# Patient Record
Sex: Female | Born: 1975 | Race: White | Hispanic: No | Marital: Married | Smoking: Never smoker
Health system: Southern US, Community
[De-identification: ages and names within clinical notes are randomized; demographics above are authoritative.]

## PROBLEM LIST (undated history)

## (undated) DIAGNOSIS — I472 Ventricular tachycardia, unspecified: Secondary | ICD-10-CM

---

## 2000-02-03 ENCOUNTER — Other Ambulatory Visit: Admission: RE | Admit: 2000-02-03 | Discharge: 2000-02-03 | Payer: Self-pay | Admitting: Gynecology

## 2001-04-12 ENCOUNTER — Other Ambulatory Visit: Admission: RE | Admit: 2001-04-12 | Discharge: 2001-04-12 | Payer: Self-pay | Admitting: Gynecology

## 2001-04-15 ENCOUNTER — Encounter: Payer: Self-pay | Admitting: Gynecology

## 2001-04-15 ENCOUNTER — Encounter: Admission: RE | Admit: 2001-04-15 | Discharge: 2001-04-15 | Payer: Self-pay | Admitting: Gynecology

## 2002-05-30 ENCOUNTER — Other Ambulatory Visit: Admission: RE | Admit: 2002-05-30 | Discharge: 2002-05-30 | Payer: Self-pay | Admitting: Gynecology

## 2003-06-29 ENCOUNTER — Other Ambulatory Visit: Admission: RE | Admit: 2003-06-29 | Discharge: 2003-06-29 | Payer: Self-pay | Admitting: Gynecology

## 2004-06-10 ENCOUNTER — Other Ambulatory Visit: Admission: RE | Admit: 2004-06-10 | Discharge: 2004-06-10 | Payer: Self-pay | Admitting: Gynecology

## 2004-06-12 ENCOUNTER — Encounter (INDEPENDENT_AMBULATORY_CARE_PROVIDER_SITE_OTHER): Payer: Self-pay | Admitting: Specialist

## 2004-06-12 ENCOUNTER — Ambulatory Visit (HOSPITAL_COMMUNITY): Admission: RE | Admit: 2004-06-12 | Discharge: 2004-06-12 | Payer: Self-pay | Admitting: Gynecology

## 2004-06-12 ENCOUNTER — Ambulatory Visit (HOSPITAL_BASED_OUTPATIENT_CLINIC_OR_DEPARTMENT_OTHER): Admission: RE | Admit: 2004-06-12 | Discharge: 2004-06-12 | Payer: Self-pay | Admitting: Gynecology

## 2004-12-04 ENCOUNTER — Other Ambulatory Visit: Admission: RE | Admit: 2004-12-04 | Discharge: 2004-12-04 | Payer: Self-pay | Admitting: Gynecology

## 2005-06-09 ENCOUNTER — Inpatient Hospital Stay (HOSPITAL_COMMUNITY): Admission: AD | Admit: 2005-06-09 | Discharge: 2005-06-11 | Payer: Self-pay | Admitting: Gynecology

## 2005-07-23 ENCOUNTER — Other Ambulatory Visit: Admission: RE | Admit: 2005-07-23 | Discharge: 2005-07-23 | Payer: Self-pay | Admitting: Gynecology

## 2006-09-14 ENCOUNTER — Other Ambulatory Visit: Admission: RE | Admit: 2006-09-14 | Discharge: 2006-09-14 | Payer: Self-pay | Admitting: Gynecology

## 2008-02-14 ENCOUNTER — Inpatient Hospital Stay (HOSPITAL_COMMUNITY): Admission: RE | Admit: 2008-02-14 | Discharge: 2008-02-15 | Payer: Self-pay | Admitting: Certified Nurse Midwife

## 2011-01-10 NOTE — H&P (Signed)
Rhonda Duncan, Rhonda Duncan          ACCOUNT NO.:  [id number]   MEDICAL RECORD NO.:  [id number]          PATIENT TYPE:  INP   LOCATION:  9166                          FACILITY:  [facility]   PHYSICIAN:  Timothy P. Fontaine, M.D.DATE OF BIRTH:  [birth year]   DATE OF ADMISSION:  06/09/2005  DATE OF DISCHARGE:                                HISTORY & PHYSICAL   CHIEF COMPLAINT:  Spontaneous rupture of membranes, labor.   HISTORY OF PRESENT ILLNESS:  A 35 year old G3, P0, AB2 female [redacted] weeks  gestation who enters with a history of spontaneous rupture of membranes and  subsequent contractions.  Triage evaluation revealed patient contracting on  a regular basis with a cervical examination of 3 cm, positive rupture of  membranes and she was admitted for labor and delivery.  Patient's beta Strep  was negative and her pregnancy is uncomplicated to date.  For remainder of  her history see her [facility].   PHYSICAL EXAMINATION:  HEENT:  Normal.  LUNGS:  Clear.  CARDIAC:  Regular rate without murmurs, rubs, or gallops.  ABDOMEN:  Gravid, vertex fetus consistent with term.  External monitors with  regular contractions q.3-4 minutes with a reactive fetal tracing.  No  decelerations.  PELVIC:  Patient is complete, complete +2 station, and pushing.   ASSESSMENT:  A 35 year old G3, P0 [redacted] weeks gestation.  Spontaneous rupture  of membranes.  Spontaneous labor at complete, complete, pushing, and  anticipation for spontaneous vaginal delivery.  Beta Strep is negative.      Timothy P. Fontaine, M.D.  Electronically Signed     TPF/MEDQ  D:  06/09/2005  T:  06/09/2005  Job:  [id number]

## 2011-01-10 NOTE — H&P (Signed)
Rhonda Duncan, HARTL          ACCOUNT NO.:  [id number]   MEDICAL RECORD NO.:  [id number]          PATIENT TYPE:  AMB   LOCATION:  [facility]                         FACILITY:  [facility]   PHYSICIAN:  Ivor Costa. Farrel Gobble, M.D. DATE OF BIRTH:  [birth year]   DATE OF ADMISSION:  06/12/2004  DATE OF DISCHARGE:                                HISTORY & PHYSICAL   CHIEF COMPLAINT:  Missed AB.   HISTORY OF PRESENT ILLNESS:  The patient is a 35 year old G2 P0 with an LMP  of March 29, 2004, estimated gestational age of 10+ weeks, who presented  yesterday for a new OB appointment.  The patient denied any bleeding or  cramping but was not found to have fetal heart tones at the time of our  appointment, an ultrasound performed showed a missed AB with a gestational  sac that measured 7-1/2 weeks, a crown-rump length that measured less than 6  weeks and no fetal heart motion was seen, there was a small subchorionic  hematoma that measured 14 x 12 x 8 mm.  She elects to have definitive  surgery in the form of a D&C.  She is O positive, antibody negative, her  hemoglobin was 13.7 with a hematocrit of 41.4.   PAST OBSTETRIC AND GYNECOLOGIC HISTORY:  Her past OB-GYN history is  unremarkable.  She had been contracepting with birth control pills prior to  planning this pregnancy.  Her periods are monthly.  Her Pap smears have all  been normal.  She has no history of fibroids or ovarian cysts.   MEDICAL HISTORY:  Negative.   PAST SURGICAL HISTORY:  Negative.   FAMILY HISTORY:  Negative for recurrent miscarriage or aneuploidy.   MEDICATIONS:  Prenatal vitamins.   ALLERGIES:  She has no known drug allergies.   PHYSICAL EXAMINATION:  GENERAL:  She is in no acute distress.  HEART:  Her heart is regular rate.  LUNGS:  Her lungs are clear to auscultation.  ABDOMEN:  Her abdomen is obese, soft, and nontender.  GU:  She has normal external female genitalia.  The BUS is negative.  The  vagina is pink and moist.   The cervix is without gross lesions.  Bimanual  exam:  The uterus is axial, mobile, nontender, and felt appropriate for  gestational age and as mentioned above no fetal heart tones are auscultated.   ASSESSMENT:  Missed abortion at 10+ weeks.   PLAN:  The patient had been counseled with regard to expectant management  versus D&C.  They called the office back on June 11, 2004 requesting to  have a D&C done and will therefore present now on June 12, 2004 to have  the procedure.  Risks of bleeding, infection, uterine perforation were  discussed and accepted.  She had been given a prescription for Lortab on  June 10, 2004 in case she should start bleeding and cramping that  evening.     Trac   THL/MEDQ  D:  06/11/2004  T:  06/11/2004  Job:  [id number]

## 2011-01-10 NOTE — Op Note (Signed)
NAMEARITZA, BRUNET          ACCOUNT NO.:  [id number]   MEDICAL RECORD NO.:  [id number]          PATIENT TYPE:  AMB   LOCATION:  [facility]                         FACILITY:  [facility]   PHYSICIAN:  Ivor Costa. Farrel Gobble, M.D. DATE OF BIRTH:  1976/06/24   DATE OF PROCEDURE:  06/12/2004  DATE OF DISCHARGE:                                 OPERATIVE REPORT   PREOPERATIVE DIAGNOSIS:  Missed abortion at 10+ weeks.   POSTOPERATIVE DIAGNOSIS:  Missed abortion at 10+ weeks.   PROCEDURE:  First trimester D&E.   SURGEON:  Ivor Costa. Farrel Gobble, M.D.   ANESTHESIA:  General.   ESTIMATED BLOOD LOSS:  Less than 100.   FINDINGS:  Uterus was axial sounding to 10 cm with contours.  Pathology was  uterine contents.   COMPLICATIONS:  None.   DESCRIPTION OF PROCEDURE:  The patient was taken to the operating room.  General anesthesia was induced and placed in the dorsal lithotomy position,  prepped and draped in the usual sterile fashion.  A bimanual exam was  performed. Orientation of the uterus was confirmed.  A sterile weighted  speculum was placed in the vagina.  The cervix was visualized and stabilized  with a single-tooth tenaculum.  The cervix was then gently dilated up to #25  [place], during which time a sound was placed for orientation inside of the  canal.  A #8 suction curette was then advanced to the cervix towards the  fundus, and the cavity was cleared of all debris.  A gentle curettage  afterwards confirmed smooth walls and cry on walls plus fundus.  The patient  received a shot of Methergine intramuscularly.  Replacement of the suction  failed to produce any further blood.  The patient tolerated the procedure  well.  Sponge, lap, and needle counts were correct x 2 and transferred to  the PACU in stable condition.     Trac   THL/MEDQ  D:  06/12/2004  T:  06/12/2004  Job:  [id number]

## 2011-05-22 LAB — CBC
HCT: 33.4 — ABNORMAL LOW
HCT: 36.8
Hemoglobin: 11.6 — ABNORMAL LOW
Hemoglobin: 12.9
MCHC: 34.7
MCHC: 35.1
MCV: 90.3
MCV: 91.9
Platelets: 190
Platelets: 224
RBC: 3.63 — ABNORMAL LOW
RBC: 4.08
RDW: 13.5
RDW: 13.9
WBC: 10.5
WBC: 11.8 — ABNORMAL HIGH

## 2011-05-22 LAB — RPR: RPR Ser Ql: NONREACTIVE

## 2014-01-09 ENCOUNTER — Other Ambulatory Visit: Payer: Self-pay

## 2014-01-09 DIAGNOSIS — Z1231 Encounter for screening mammogram for malignant neoplasm of breast: Secondary | ICD-10-CM

## 2014-02-06 ENCOUNTER — Ambulatory Visit: Payer: Self-pay

## 2014-02-06 ENCOUNTER — Ambulatory Visit
Admission: RE | Admit: 2014-02-06 | Discharge: 2014-02-06 | Disposition: A | Discharge status: Home / Self Care | Payer: BC Managed Care – PPO | Source: Ambulatory Visit

## 2014-02-06 DIAGNOSIS — Z1231 Encounter for screening mammogram for malignant neoplasm of breast: Secondary | ICD-10-CM

## 2015-02-08 ENCOUNTER — Other Ambulatory Visit: Payer: Self-pay

## 2015-02-08 DIAGNOSIS — Z1231 Encounter for screening mammogram for malignant neoplasm of breast: Secondary | ICD-10-CM

## 2015-03-07 ENCOUNTER — Ambulatory Visit
Admission: RE | Admit: 2015-03-07 | Discharge: 2015-03-07 | Disposition: A | Discharge status: Home / Self Care | Payer: BC Managed Care – PPO | Source: Ambulatory Visit

## 2015-03-07 DIAGNOSIS — Z1231 Encounter for screening mammogram for malignant neoplasm of breast: Secondary | ICD-10-CM

## 2015-03-09 ENCOUNTER — Other Ambulatory Visit: Payer: Self-pay | Admitting: Certified Nurse Midwife

## 2015-03-09 DIAGNOSIS — R928 Other abnormal and inconclusive findings on diagnostic imaging of breast: Secondary | ICD-10-CM

## 2015-03-15 ENCOUNTER — Ambulatory Visit
Admission: RE | Admit: 2015-03-15 | Discharge: 2015-03-15 | Disposition: A | Discharge status: Home / Self Care | Payer: BC Managed Care – PPO | Source: Ambulatory Visit | Attending: Certified Nurse Midwife | Admitting: Certified Nurse Midwife

## 2015-03-15 DIAGNOSIS — R928 Other abnormal and inconclusive findings on diagnostic imaging of breast: Secondary | ICD-10-CM

## 2016-03-05 ENCOUNTER — Other Ambulatory Visit: Payer: Self-pay | Admitting: Certified Nurse Midwife

## 2016-03-05 DIAGNOSIS — Z1231 Encounter for screening mammogram for malignant neoplasm of breast: Secondary | ICD-10-CM

## 2016-03-24 ENCOUNTER — Ambulatory Visit
Admission: RE | Admit: 2016-03-24 | Discharge: 2016-03-24 | Disposition: A | Discharge status: Home / Self Care | Payer: BC Managed Care – PPO | Source: Ambulatory Visit | Attending: Certified Nurse Midwife | Admitting: Certified Nurse Midwife

## 2016-03-24 DIAGNOSIS — Z1231 Encounter for screening mammogram for malignant neoplasm of breast: Secondary | ICD-10-CM

## 2017-02-17 ENCOUNTER — Other Ambulatory Visit: Payer: Self-pay | Admitting: Certified Nurse Midwife

## 2017-02-17 DIAGNOSIS — Z1231 Encounter for screening mammogram for malignant neoplasm of breast: Secondary | ICD-10-CM

## 2017-04-02 ENCOUNTER — Ambulatory Visit
Admission: RE | Admit: 2017-04-02 | Discharge: 2017-04-02 | Disposition: A | Discharge status: Home / Self Care | Payer: BC Managed Care – PPO | Source: Ambulatory Visit | Attending: Certified Nurse Midwife | Admitting: Certified Nurse Midwife

## 2017-04-02 DIAGNOSIS — Z1231 Encounter for screening mammogram for malignant neoplasm of breast: Secondary | ICD-10-CM

## 2017-04-06 ENCOUNTER — Other Ambulatory Visit: Payer: Self-pay | Admitting: Certified Nurse Midwife

## 2017-04-06 DIAGNOSIS — R928 Other abnormal and inconclusive findings on diagnostic imaging of breast: Secondary | ICD-10-CM

## 2017-04-10 ENCOUNTER — Other Ambulatory Visit: Payer: Self-pay | Admitting: Certified Nurse Midwife

## 2017-04-10 ENCOUNTER — Ambulatory Visit
Admission: RE | Admit: 2017-04-10 | Discharge: 2017-04-10 | Disposition: A | Discharge status: Home / Self Care | Payer: BC Managed Care – PPO | Source: Ambulatory Visit | Attending: Certified Nurse Midwife | Admitting: Certified Nurse Midwife

## 2017-04-10 ENCOUNTER — Other Ambulatory Visit: Payer: BC Managed Care – PPO

## 2017-04-10 DIAGNOSIS — R928 Other abnormal and inconclusive findings on diagnostic imaging of breast: Secondary | ICD-10-CM

## 2017-08-25 HISTORY — PX: BREAST BIOPSY: SHX20

## 2017-10-12 ENCOUNTER — Other Ambulatory Visit: Payer: Self-pay | Admitting: Certified Nurse Midwife

## 2017-10-12 ENCOUNTER — Ambulatory Visit
Admission: RE | Admit: 2017-10-12 | Discharge: 2017-10-12 | Disposition: A | Discharge status: Home / Self Care | Payer: BC Managed Care – PPO | Source: Ambulatory Visit | Attending: Certified Nurse Midwife | Admitting: Certified Nurse Midwife

## 2017-10-12 DIAGNOSIS — N6489 Other specified disorders of breast: Secondary | ICD-10-CM

## 2017-10-12 DIAGNOSIS — R928 Other abnormal and inconclusive findings on diagnostic imaging of breast: Secondary | ICD-10-CM

## 2018-02-22 ENCOUNTER — Other Ambulatory Visit: Payer: Self-pay | Admitting: Certified Nurse Midwife

## 2018-02-22 DIAGNOSIS — Z1231 Encounter for screening mammogram for malignant neoplasm of breast: Secondary | ICD-10-CM

## 2018-04-06 ENCOUNTER — Ambulatory Visit
Admission: RE | Admit: 2018-04-06 | Discharge: 2018-04-06 | Disposition: A | Discharge status: Home / Self Care | Payer: BC Managed Care – PPO | Source: Ambulatory Visit | Attending: Certified Nurse Midwife | Admitting: Certified Nurse Midwife

## 2018-04-06 DIAGNOSIS — Z1231 Encounter for screening mammogram for malignant neoplasm of breast: Secondary | ICD-10-CM

## 2018-07-02 ENCOUNTER — Inpatient Hospital Stay (HOSPITAL_COMMUNITY)
Admission: EM | Admit: 2018-07-02 | Discharge: 2018-07-04 | DRG: 310 | Disposition: A | Discharge status: Home / Self Care | Payer: BC Managed Care – PPO | Attending: Cardiology | Admitting: Cardiology

## 2018-07-02 ENCOUNTER — Other Ambulatory Visit: Payer: Self-pay

## 2018-07-02 ENCOUNTER — Encounter (HOSPITAL_COMMUNITY): Payer: Self-pay | Admitting: *Deleted

## 2018-07-02 ENCOUNTER — Emergency Department (HOSPITAL_COMMUNITY): Payer: BC Managed Care – PPO

## 2018-07-02 DIAGNOSIS — E875 Hyperkalemia: Secondary | ICD-10-CM | POA: Diagnosis not present

## 2018-07-02 DIAGNOSIS — I472 Ventricular tachycardia: Secondary | ICD-10-CM | POA: Diagnosis not present

## 2018-07-02 DIAGNOSIS — I4729 Other ventricular tachycardia: Secondary | ICD-10-CM

## 2018-07-02 DIAGNOSIS — I340 Nonrheumatic mitral (valve) insufficiency: Secondary | ICD-10-CM | POA: Diagnosis not present

## 2018-07-02 HISTORY — DX: Ventricular tachycardia, unspecified: I47.20

## 2018-07-02 HISTORY — DX: Ventricular tachycardia: I47.2

## 2018-07-02 LAB — CBC WITH DIFFERENTIAL/PLATELET
ABS IMMATURE GRANULOCYTES: 0.04 10*3/uL (ref 0.00–0.07)
BASOS PCT: 1 %
Basophils Absolute: 0.1 10*3/uL (ref 0.0–0.1)
EOS PCT: 0 %
Eosinophils Absolute: 0 10*3/uL (ref 0.0–0.5)
HCT: 46 % (ref 36.0–46.0)
HEMOGLOBIN: 14.5 g/dL (ref 12.0–15.0)
Immature Granulocytes: 0 %
LYMPHS PCT: 19 %
Lymphs Abs: 2.6 10*3/uL (ref 0.7–4.0)
MCH: 29.4 pg (ref 26.0–34.0)
MCHC: 31.5 g/dL (ref 30.0–36.0)
MCV: 93.1 fL (ref 80.0–100.0)
MONOS PCT: 6 %
Monocytes Absolute: 0.8 10*3/uL (ref 0.1–1.0)
Neutro Abs: 9.9 10*3/uL — ABNORMAL HIGH (ref 1.7–7.7)
Neutrophils Relative %: 74 %
Platelets: 295 10*3/uL (ref 150–400)
RBC: 4.94 MIL/uL (ref 3.87–5.11)
RDW: 11.9 % (ref 11.5–15.5)
WBC: 13.4 10*3/uL — ABNORMAL HIGH (ref 4.0–10.5)
nRBC: 0 % (ref 0.0–0.2)

## 2018-07-02 LAB — I-STAT TROPONIN, ED: Troponin i, poc: 0 ng/mL (ref 0.00–0.08)

## 2018-07-02 LAB — COMPREHENSIVE METABOLIC PANEL
ALBUMIN: 4.1 g/dL (ref 3.5–5.0)
ALT: 28 U/L (ref 0–44)
AST: 42 U/L — AB (ref 15–41)
Alkaline Phosphatase: 84 U/L (ref 38–126)
Anion gap: 12 (ref 5–15)
BILIRUBIN TOTAL: 1.7 mg/dL — AB (ref 0.3–1.2)
BUN: 13 mg/dL (ref 6–20)
CO2: 21 mmol/L — ABNORMAL LOW (ref 22–32)
CREATININE: 0.86 mg/dL (ref 0.44–1.00)
Calcium: 9.1 mg/dL (ref 8.9–10.3)
Chloride: 104 mmol/L (ref 98–111)
GFR calc Af Amer: >60 mL/min (ref >60)
GFR calc non Af Amer: >60 mL/min (ref >60)
GLUCOSE: 110 mg/dL — AB (ref 70–99)
POTASSIUM: 5.8 mmol/L — AB (ref 3.5–5.1)
Sodium: 137 mmol/L (ref 135–145)
TOTAL PROTEIN: 7.5 g/dL (ref 6.5–8.1)

## 2018-07-02 LAB — I-STAT BETA HCG BLOOD, ED (MC, WL, AP ONLY): I-stat hCG, quantitative: <5 m[IU]/mL (ref <5)

## 2018-07-02 LAB — I-STAT CHEM 8, ED
BUN: 17 mg/dL (ref 6–20)
CHLORIDE: 107 mmol/L (ref 98–111)
Calcium, Ion: 1.03 mmol/L — ABNORMAL LOW (ref 1.15–1.40)
Creatinine, Ser: 0.7 mg/dL (ref 0.44–1.00)
GLUCOSE: 112 mg/dL — AB (ref 70–99)
HCT: 44 % (ref 36.0–46.0)
Hemoglobin: 15 g/dL (ref 12.0–15.0)
Potassium: 5.2 mmol/L — ABNORMAL HIGH (ref 3.5–5.1)
Sodium: 140 mmol/L (ref 135–145)
TCO2: 24 mmol/L (ref 22–32)

## 2018-07-02 LAB — TSH: TSH: 1.637 u[IU]/mL (ref 0.350–4.500)

## 2018-07-02 LAB — MAGNESIUM: MAGNESIUM: 1.9 mg/dL (ref 1.7–2.4)

## 2018-07-02 MED ORDER — ENOXAPARIN SODIUM 40 MG/0.4ML ~~LOC~~ SOLN
40.0000 mg | SUBCUTANEOUS | Status: DC
  Administered 2018-07-03 – 2018-07-04 (×2): 40 mg via SUBCUTANEOUS
  Filled 2018-07-02 (×2): qty 0.4

## 2018-07-02 MED ORDER — AMIODARONE HCL IN DEXTROSE 360-4.14 MG/200ML-% IV SOLN
60.0000 mg/h | Freq: Once | INTRAVENOUS | Status: AC
  Administered 2018-07-02: 60 mg/h via INTRAVENOUS
  Filled 2018-07-02: qty 200

## 2018-07-02 MED ORDER — ACETAMINOPHEN 325 MG PO TABS
650.0000 mg | ORAL_TABLET | Freq: Four times a day (QID) | ORAL | Status: DC | PRN
  Administered 2018-07-03 – 2018-07-04 (×2): 650 mg via ORAL
  Filled 2018-07-02 (×2): qty 2

## 2018-07-02 MED ORDER — ACETAMINOPHEN 650 MG RE SUPP: 650.0000 mg | Freq: Four times a day (QID) | RECTAL | Status: DC | PRN

## 2018-07-02 MED ORDER — SODIUM CHLORIDE 0.9% FLUSH: 3.0000 mL | INTRAVENOUS | Status: DC | PRN

## 2018-07-02 MED ORDER — AMIODARONE HCL IN DEXTROSE 360-4.14 MG/200ML-% IV SOLN: 30.0000 mg/h | INTRAVENOUS | Status: DC

## 2018-07-02 MED ORDER — SODIUM CHLORIDE 0.9 % IV SOLN: 250.0000 mL | INTRAVENOUS | Status: DC | PRN

## 2018-07-02 MED ORDER — AMIODARONE HCL IN DEXTROSE 360-4.14 MG/200ML-% IV SOLN
60.0000 mg/h | INTRAVENOUS | Status: DC
  Administered 2018-07-03: 60 mg/h via INTRAVENOUS
  Filled 2018-07-02: qty 200

## 2018-07-02 MED ORDER — AMIODARONE IV BOLUS ONLY 150 MG/100ML
150.0000 mg | Freq: Once | INTRAVENOUS | Status: AC
  Administered 2018-07-02: 150 mg via INTRAVENOUS
  Filled 2018-07-02: qty 100

## 2018-07-02 MED ORDER — SODIUM CHLORIDE 0.9% FLUSH
3.0000 mL | Freq: Two times a day (BID) | INTRAVENOUS | Status: DC
  Administered 2018-07-04 (×2): 3 mL via INTRAVENOUS

## 2018-07-02 NOTE — ED Triage Notes (Signed)
thwe pt has had diarrhea  For the past 2 or 3 days.  Today she went to her doctors office and was sent here for an irregular heart beat  No chest pain she feels breathless  lmp oct 30th

## 2018-07-02 NOTE — ED Notes (Signed)
Someone from cards has seen the pt  Periods of rapid heart rate

## 2018-07-02 NOTE — ED Notes (Signed)
2 w did not want this cardiac pt  Waiting on another room

## 2018-07-02 NOTE — H&P (Addendum)
Cardiology Admission History and Physical:   Patient ID: Rhonda Duncan MRN: [id number]; DOB: 1975/10/27   Admission date: 07/02/2018  Primary Care Provider: Gweneth Dimitri, MD Primary Cardiologist: New to [facility] Primary Electrophysiologist:  None   Chief Complaint:  Referred from outpatient office for abnormal heart rhythm  Patient Profile:   Rhonda Duncan is a 42 y.o. female with no significant PMH who presents with episodes of SOB and lightheadedness, found to have an arrhythmia at outpatient office.   History of Present Illness:   Ms. Beamon has had intermittent episodes of sudden onset breathlessness/SOB and lightheadedness which last for seconds at a time over the past week. She reported increasing frequency of episodes over the past several days. She denies any chest pain associated with these symptoms. 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 She reported that her husband checked her pulse when she got home and felt like it was irregular. She presented to an urgent care, was told she had an irregular heart beat, and was recommended to present to the ED for further evaluation.   She denies prior cardiac history. She reported having some indigestion type pain in the last 1-2 years for which she underwent an EKG and blood work at that time which was unrevealing for any abnormalities. No ischemic evaluation in the past. She denies history of HTN, DM, HLD, or tobacco use. She reports a family history of CAD: father (alive) with CABG in his 51s and paternal grandfather died from an MI in his 38s-60s.   At the time of this evaluation she is asymptomatic. She reports having watery diarrhea for the past 3 days with 2 episodes per day. Otherwise no recent fever, nausea, vomiting, hematochezia, or melena. She has never experienced exertional chest pain. She reports occasional heartburn. She denies orthopnea,  PND, LE edema, syncope, or palpitations.  ED course: Intermittently tachycardic and hypertensive, otherwise VSS. Labs notable for K 5.2, Cr 0.7, WBC 13.4, Hgb 14.5, PLT 295, Trop negative x1, B hcg negative. EKG with VT. CXR with pulmonary venous hypertension. She was started on amiodarone in the ED. Cardiology asked to admit.  History reviewed. No pertinent past medical history.  History reviewed. No pertinent surgical history.   Medications Prior to Admission: Prior to Admission medications   Not on File     Allergies:   No Known Allergies  Social History:   Social History   Socioeconomic History  . Marital status: Married    Spouse name: Not on file  . Number of children: Not on file  . Years of education: Not on file  . Highest education level: Not on file  Occupational History  . Not on file  Social Needs  . Financial resource strain: Not on file  . Food insecurity:    Worry: Not on file    Inability: Not on file  . Transportation needs:    Medical: Not on file    Non-medical: Not on file  Tobacco Use  . Smoking status: Never Smoker  . Smokeless tobacco: Never Used  Substance and Sexual Activity  . Alcohol use: Yes  . Drug use: Never  . Sexual activity: Not on file  Lifestyle  . Physical activity:    Days per week: Not on file    Minutes per session: Not on file  . Stress: Not on file  Relationships  . Social connections:    Talks  on phone: Not on file    Gets together: Not on file    Attends religious service: Not on file    Active member of club or organization: Not on file    Attends meetings of clubs or organizations: Not on file    Relationship status: Not on file  . Intimate partner violence:    Fear of current or ex partner: Not on file    Emotionally abused: Not on file    Physically abused: Not on file    Forced sexual activity: Not on file  Other Topics Concern  . Not on file  Social History Narrative  . Not on file    Family History:     The patient's family history includes Heart attack in her paternal grandfather; Heart disease in her father. There is no history of Breast cancer.    ROS:  Please see the history of present illness.  All other ROS reviewed and negative.     Physical Exam/Data:   Vitals:   07/02/18 1830 07/02/18 1845 07/02/18 1900 07/02/18 1915  BP: (!) 145/90 126/67 (!) 154/88 136/80  Pulse: 86 74 (!) 102 76  Resp: 17 19 18  (!) 27  Temp:      SpO2: 100% 100% 100% 100%  Weight:      Height:       No intake or output data in the 24 hours ending 07/02/18 1959 Filed Weights   07/02/18 1804  Weight: 107 kg   Body mass index is 38.09 kg/m.  General:  Well nourished, well developed, laying in bed in no acute distress HEENT: sclera anicteric Neck: no JVD Vascular: No carotid bruits; distal pulses 2+ bilaterally Cardiac:  normal S1, S2; RRR; no murmurs, rubs, or gallops Lungs:  clear to auscultation bilaterally, no wheezing, rhonchi or rales  Abd: soft, obese, nontender, no hepatomegaly  Ext: no edema Musculoskeletal:  No deformities, BUE and BLE strength normal and equal Skin: warm and dry  Neuro:  CNs 2-12 intact, no focal abnormalities noted Psych:  Normal affect    EKG:  Ventricular tachycardia   Relevant CV Studies: None  Laboratory Data:  Chemistry Recent Labs  Lab 07/02/18 1817 07/02/18 1819  NA 137 140  K 5.8* 5.2*  CL 104 107  CO2 21*  --   GLUCOSE 110* 112*  BUN 13 17  CREATININE 0.86 0.70  CALCIUM 9.1  --   GFRNONAA >60  --   GFRAA >60  --   ANIONGAP 12  --     Recent Labs  Lab 07/02/18 1817  PROT 7.5  ALBUMIN 4.1  AST 42*  ALT 28  ALKPHOS 84  BILITOT 1.7*   Hematology Recent Labs  Lab 07/02/18 1817 07/02/18 1819  WBC 13.4*  --   RBC 4.94  --   HGB 14.5 15.0  HCT 46.0 44.0  MCV 93.1  --   MCH 29.4  --   MCHC 31.5  --   RDW 11.9  --   PLT 295  --    Cardiac EnzymesNo results for input(s): TROPONINI in the last 168 hours.  Recent Labs  Lab  07/02/18 1818  TROPIPOC 0.00    BNPNo results for input(s): BNP, PROBNP in the last 168 hours.  DDimer No results for input(s): DDIMER in the last 168 hours.  Radiology/Studies:  Dg Chest Portable 1 View  Result Date: 07/02/2018 CLINICAL DATA:  42 y/o  F; irregular heartbeat. EXAM: PORTABLE CHEST 1 VIEW COMPARISON:  None. FINDINGS:  Normal cardiac silhouette given projection and technique. Transcutaneous pacing pads noted. Pulmonary venous hypertension. No consolidation, effusion, or pneumothorax. No acute osseous abnormality is evident. IMPRESSION: Pulmonary venous hypertension. No acute pulmonary process identified. Electronically Signed   By: Mitzi Hansen M.D.   On: 07/02/2018 18:47    Assessment and Plan:   1. VT in patient without known structural heart disease: she describes intermittent episodes of breathlessness and lightheadedness which last for seconds at a time, occurring more frequently over the past few days, with an episode of near-syncope prompting further evaluation. EKG and telemetry with episodes of VT, frequent PVCs, and NSVT. K 5.2, Mg 1.9, TSH wnl. Started on amiodarone for rhythm control.  - Continue amiodarone gtt for now - Consider EP evaluation in AM - Will likely need cardiac MRI to further evaluate cause of VT - Will check an echocardiogram - Will check a lipid panel, Hgb A1C for risk stratification  - Continue to trend trop x3  2. BP mildly elevated: no history of HTN - Will continue to monitor for now  3. Diarrhea: likely viral gastroenteritis. WBC mildly elevated.  - Continue supportive care  4. Hyperkalemia: K 5.2 on admission; doubt this is contributing to present illness.  - Check BMET in AM - if persistently elevated, consider reversal.   Severity of Illness: The appropriate patient status for this patient is INPATIENT. Inpatient status is judged to be reasonable and necessary in order to provide the required intensity of service to ensure  the patient's safety. The patient's presenting symptoms, physical exam findings, and initial radiographic and laboratory data in the context of their chronic comorbidities is felt to place them at high risk for further clinical deterioration. Furthermore, it is not anticipated that the patient will be medically stable for discharge from the hospital within 2 midnights of admission. The following factors support the patient status of inpatient.   " The patient's presenting symptoms include near-syncope. " The worrisome physical exam findings include benign cardiopulmonary exam. " The initial radiographic and laboratory data are worrisome because of EKG and telemetry with ventricular tachycardia. " The chronic co-morbidities include none.   * I certify that at the point of admission it is my clinical judgment that the patient will require inpatient hospital care spanning beyond 2 midnights from the point of admission due to high intensity of service, high risk for further deterioration and high frequency of surveillance required.*    For questions or updates, please contact [facility] Please consult [email or web address] for contact info under   Signed, Beatriz Stallion, PA-C  07/02/2018 7:59 PM   Physician Note:  Patient seen and examined with the above-signed Advanced Practice Provider and/or Housestaff. I personally reviewed laboratory data, imaging studies and relevant notes. I independently examined the patient and formulated the important aspects of the plan. I have edited the note to reflect any of my changes or salient points. I have personally discussed the plan with the patient and/or family.  The patient presents with dizziness and palpitations and documentation of runs of sustained VT (self-terminating). She has normal electrolytes. The QTc is not prolonged. There is no family history of sudden cardiac death. She does admit to have GI disturbance for 2 days. 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 The WBC is slightly elevated at 13.4. The cardiovascular examination is normal. The CXR does not show interstitial edema or pleural effusions.  The plan is for the patient to get  admitted to the hospital. Continue IV Amiodarone infusion. Obtain a transthoracic echo to rule out structural heart disease. Will get EP input in the AM. If the echo is inconclusive then will consider a cardiac MR. Viral myocarditis can also be considered but ECG and negative troponin do not support this. Will continue to monitor her on telemetry.  Donnald Garre, MD, [facility]

## 2018-07-02 NOTE — ED Notes (Signed)
Pt still having runs of v-t

## 2018-07-02 NOTE — ED Notes (Signed)
Nasal 02 applied whenever she arrived at 2 liters

## 2018-07-02 NOTE — ED Notes (Signed)
nsr at present no extra beats

## 2018-07-02 NOTE — ED Notes (Signed)
Unsuccessful attempt to call report charge burse has to approve

## 2018-07-02 NOTE — ED Provider Notes (Signed)
[facility] EMERGENCY DEPARTMENT Provider Note   CSN: [id number] Arrival date & time: 07/02/18  1748     History   Chief Complaint No chief complaint on file.   HPI Day Rhonda Duncan is a 42 y.o. female.  Patient presents to the emergency department from urgent care today with complaints of breathlessness and shortness of breath.  Patient started having diarrhea 2 to 3 days ago.  She had intermittent episodes where she would have to stop and catch her breath.  She had some dull "indigestion" type chest pain as well.  These episodes became more frequent today to the point where she was nearly passing out.  She was found to have occasional PVCs at outside urgent care and was sent to the emergency department.  Patient does not have a history of hypertension, high cholesterol, diabetes.  She does not smoke.  She does have family history of heart disease and MI on her father's side.  No history of thyroid abnormality.  She is otherwise in her normal state of health.     History reviewed. No pertinent past medical history.  There are no active problems to display for this patient.   History reviewed. No pertinent surgical history.   OB History   None      Home Medications    Prior to Admission medications   Not on File    Family History Family History  Problem Relation Age of Onset  . Breast cancer Neg Hx     Social History Social History   Tobacco Use  . Smoking status: Never Smoker  . Smokeless tobacco: Never Used  Substance Use Topics  . Alcohol use: Yes  . Drug use: Never     Allergies   Patient has no known allergies.   Review of Systems Review of Systems  Constitutional: Negative for diaphoresis and fever.  Eyes: Negative for redness.  Respiratory: Positive for shortness of breath. Negative for cough.   Cardiovascular: Positive for chest pain. Negative for palpitations and leg swelling.  Gastrointestinal: Negative for abdominal  pain, nausea and vomiting.  Genitourinary: Negative for dysuria.  Musculoskeletal: Negative for back pain and neck pain.  Skin: Negative for rash.  Neurological: Positive for light-headedness. Negative for syncope.  Psychiatric/Behavioral: The patient is nervous/anxious.      Physical Exam Updated Vital Signs Pulse (!) 108   Temp 98.8 F (37.1 C)   Resp 20   Ht 5\' 6"  (1.676 m)   Wt 107 kg   LMP 06/24/2018   BMI 38.09 kg/m   Physical Exam  Constitutional: She appears well-developed and well-nourished.  HENT:  Head: Normocephalic and atraumatic.  Eyes: Conjunctivae are normal. Right eye exhibits no discharge. Left eye exhibits no discharge.  Neck: Normal range of motion. Neck supple.  Cardiovascular: Normal heart sounds. An irregularly irregular rhythm present. Tachycardia present.  Pulmonary/Chest: Effort normal and breath sounds normal.  Abdominal: Soft. There is no tenderness.  Neurological: She is alert.  Skin: Skin is warm and dry.  Psychiatric: Her mood appears anxious.  Nursing note and vitals reviewed.    ED Treatments / Results  Labs (all labs ordered are listed, but only abnormal results are displayed) Labs Reviewed  I-STAT CHEM 8, ED - Abnormal; Notable for the following components:      Result Value   Potassium 5.2 (*)    Glucose, Bld 112 (*)    Calcium, Ion 1.03 (*)    All other components within normal limits  COMPREHENSIVE METABOLIC PANEL  CBC WITH DIFFERENTIAL/PLATELET  TSH  MAGNESIUM  I-STAT TROPONIN, ED  I-STAT BETA HCG BLOOD, ED ([facility], [facility], [facility] ONLY)    EKG EKG Interpretation  Date/Time:  Friday July 02 2018 17:57:32 EST Ventricular Rate:  197 PR Interval:    QRS Duration: 106 QT Interval:  316 QTC Calculation: 410 R Axis:   16 Text Interpretation:  Ventricular tachycardia, unsustained Minimal ST depression Baseline wander in lead(s) V1 V2 Confirmed by Kristine Royal [phone]) on 07/02/2018 6:03:48 PM   Radiology No results  found.  Procedures Procedures (including critical care time)  Medications Ordered in ED Medications  amiodarone (NEXTERONE) IV bolus only 150 mg/100 mL (150 mg Intravenous New Bag/Given 07/02/18 1817)     Initial Impression / Assessment and Plan / ED Course  I have reviewed the triage vital signs and the nursing notes.  Pertinent labs & imaging results that were available during my care of the patient were reviewed by me and considered in my medical decision making (see chart for details).     Patient seen and examined. Vtach identified on monitor. She is tolerating well. Patient seen immediately with Dr. Rodena Medin. Amio ordered. Labs are in. Will consult to cardiology.   Vital signs reviewed and are as follows: BP (!) 146/97   Pulse 73   Temp 98.8 F (37.1 C)   Resp 17   Ht 5\' 6"  (1.676 m)   Wt 107 kg   LMP 06/24/2018   SpO2 100%   BMI 38.09 kg/m   6:24 PM Spoke with Dr. Eden Emms who will see patient in ED.   K noted to be slightly high. Will await lab result as cannot evaluate for hemolysis.  6:54 PM Pt stable.   7:59 PM Cards PA has seen patient. Continuing to have intermittent Vtach but appears well. Amio infusion running.   BP 136/80   Pulse 76   Temp 98.8 F (37.1 C)   Resp (!) 27   Ht 5\' 6"  (1.676 m)   Wt 107 kg   LMP 06/24/2018   SpO2 100%   BMI 38.09 kg/m    8:25 PM Cardiology admit.   CRITICAL CARE Performed by: Renne Crigler PA-C Total critical care time: 45 minutes Critical care time was exclusive of separately billable procedures and treating other patients. Critical care was necessary to treat or prevent imminent or life-threatening deterioration. Critical care was time spent personally by me on the following activities: development of treatment plan with patient and/or surrogate as well as nursing, discussions with consultants, evaluation of patient's response to treatment, examination of patient, obtaining history from patient or surrogate,  ordering and performing treatments and interventions, ordering and review of laboratory studies, ordering and review of radiographic studies, pulse oximetry and re-evaluation of patient's condition.    Final Clinical Impressions(s) / ED Diagnoses   Final diagnoses:  Non-sustained ventricular tachycardia (HCC)   Admit, non-sustained VT.   ED Discharge Orders    None       Renne Crigler, Cordelia Poche 07/02/18 2026    Wynetta Fines, MD 07/02/18 [phone]

## 2018-07-02 NOTE — ED Notes (Signed)
occassional pvcs

## 2018-07-03 ENCOUNTER — Other Ambulatory Visit: Payer: Self-pay

## 2018-07-03 ENCOUNTER — Inpatient Hospital Stay (HOSPITAL_COMMUNITY): Payer: BC Managed Care – PPO

## 2018-07-03 ENCOUNTER — Encounter (HOSPITAL_COMMUNITY): Payer: Self-pay

## 2018-07-03 DIAGNOSIS — I340 Nonrheumatic mitral (valve) insufficiency: Secondary | ICD-10-CM

## 2018-07-03 LAB — ECHOCARDIOGRAM COMPLETE
Height: 66 in
WEIGHTICAEL: 3734.4 [oz_av]

## 2018-07-03 LAB — BASIC METABOLIC PANEL
Anion gap: 8 (ref 5–15)
BUN: 11 mg/dL (ref 6–20)
CO2: 21 mmol/L — ABNORMAL LOW (ref 22–32)
CREATININE: 0.8 mg/dL (ref 0.44–1.00)
Calcium: 8.6 mg/dL — ABNORMAL LOW (ref 8.9–10.3)
Chloride: 112 mmol/L — ABNORMAL HIGH (ref 98–111)
GFR calc non Af Amer: >60 mL/min (ref >60)
GLUCOSE: 97 mg/dL (ref 70–99)
POTASSIUM: 3.5 mmol/L (ref 3.5–5.1)
Sodium: 141 mmol/L (ref 135–145)

## 2018-07-03 LAB — CBC
HEMATOCRIT: 39.8 % (ref 36.0–46.0)
Hemoglobin: 12.7 g/dL (ref 12.0–15.0)
MCH: 28.4 pg (ref 26.0–34.0)
MCHC: 31.9 g/dL (ref 30.0–36.0)
MCV: 89 fL (ref 80.0–100.0)
Platelets: 287 10*3/uL (ref 150–400)
RBC: 4.47 MIL/uL (ref 3.87–5.11)
RDW: 11.9 % (ref 11.5–15.5)
WBC: 9 10*3/uL (ref 4.0–10.5)
nRBC: 0 % (ref 0.0–0.2)

## 2018-07-03 LAB — LIPID PANEL
Cholesterol: 157 mg/dL (ref 0–200)
HDL: 42 mg/dL (ref >40)
LDL CALC: 96 mg/dL (ref 0–99)
Total CHOL/HDL Ratio: 3.7 RATIO
Triglycerides: 94 mg/dL (ref <150)
VLDL: 19 mg/dL (ref 0–40)

## 2018-07-03 LAB — HIV ANTIBODY (ROUTINE TESTING W REFLEX): HIV Screen 4th Generation wRfx: NONREACTIVE

## 2018-07-03 LAB — TROPONIN I: Troponin I: <0.03 ng/mL (ref <0.03)

## 2018-07-03 LAB — HEMOGLOBIN A1C
Hgb A1c MFr Bld: 5.5 % (ref 4.8–5.6)
MEAN PLASMA GLUCOSE: 111.15 mg/dL

## 2018-07-03 LAB — MRSA PCR SCREENING: MRSA by PCR: NEGATIVE

## 2018-07-03 MED ORDER — NADOLOL 20 MG PO TABS
20.0000 mg | ORAL_TABLET | Freq: Every day | ORAL | Status: DC
  Administered 2018-07-03 – 2018-07-04 (×2): 20 mg via ORAL
  Filled 2018-07-03 (×2): qty 1

## 2018-07-03 NOTE — Progress Notes (Signed)
*  PRELIMINARY RESULTS* Echocardiogram 2D Echocardiogram has been performed.  Jeryl Columbia 07/03/2018, 1:45 PM

## 2018-07-03 NOTE — Consult Note (Signed)
ELECTROPHYSIOLOGY CONSULT NOTE    Primary Care Physician: Gweneth Dimitri, MD Referring Physician:  Dr Purvis Sheffield  Admit Date: 07/02/2018  Reason for consultation:  VT  Rhonda Duncan is a 42 y.o. female without significant PMH who now presents with symptomatic NSVT.  She reports having brief palpitations rare for about 2 years.  Over the past 3 days, she has had very frequent episodes.   She reports abrupt onset of tachypalpitations with dizziness/ lightheadedness and breathlessness.  She also has a sense of "indigestion" associated with episodes.  She presents to [facility] and is found to have frequent salvos of NSVT.  Today, she denies symptoms of chest pain, shortness of breath, orthopnea, PND, lower extremity edema,  syncope, or neurologic sequela. The patient is tolerating medications without difficulties and is otherwise without complaint today.   Past Medical History:  Diagnosis Date  . Ventricular tachycardia (HCC)    History reviewed. No pertinent surgical history.  . enoxaparin (LOVENOX) injection  40 mg Subcutaneous Q24H  . sodium chloride flush  3 mL Intravenous Q12H   . sodium chloride    . amiodarone 30 mg/hr (07/03/18 0231)    No Known Allergies  Social History   Socioeconomic History  . Marital status: Married    Spouse name: Not on file  . Number of children: Not on file  . Years of education: Not on file  . Highest education level: Not on file  Occupational History  . Not on file  Social Needs  . Financial resource strain: Not on file  . Food insecurity:    Worry: Not on file    Inability: Not on file  . Transportation needs:    Medical: Not on file    Non-medical: Not on file  Tobacco Use  . Smoking status: Never Smoker  . Smokeless tobacco: Never Used  Substance and Sexual Activity  . Alcohol use: Yes  . Drug use: Never  . Sexual activity: Not on file  Lifestyle  . Physical activity:    Days per week: Not on file    Minutes per  session: Not on file  . Stress: Not on file  Relationships  . Social connections:    Talks on phone: Not on file    Gets together: Not on file    Attends religious service: Not on file    Active member of club or organization: Not on file    Attends meetings of clubs or organizations: Not on file    Relationship status: Not on file  . Intimate partner violence:    Fear of current or ex partner: Not on file    Emotionally abused: Not on file    Physically abused: Not on file    Forced sexual activity: Not on file  Other Topics Concern  . Not on file  Social History Narrative   Lives in [place] with spouse.      2 children      Works as a Electrical engineer at [employer]    Family History  Problem Relation Age of Onset  . Heart disease Father        CAGB in his 47s  . Heart attack Paternal Grandfather        died from MI in 27s-60s  . Breast cancer Neg Hx     ROS- All systems are reviewed and negative except as per the HPI above  Physical Exam: Telemetry:  Sinus with frequent salvos of NSVT Vitals:  07/03/18 0144 07/03/18 0232 07/03/18 0538 07/03/18 0806  BP: 106/78 116/68 128/76 132/77  Pulse:   76 66  Resp:      Temp:   98.1 F (36.7 C) 98.3 F (36.8 C)  TempSrc:   Oral Oral  SpO2:   96% 99%  Weight:   105.9 kg   Height:        GEN- The patient is well appearing, alert and oriented x 3 today.   Head- normocephalic, atraumatic Eyes-  Sclera clear, conjunctiva pink Ears- hearing intact Oropharynx- clear Neck- supple, no JVP Lymph- no cervical lymphadenopathy Lungs- Clear to ausculation bilaterally, normal work of breathing Heart- Regular rate and rhythm, no murmurs, rubs or gallops, PMI not laterally displaced GI- soft, NT, ND, + BS Extremities- no clubbing, cyanosis, or edema MS- no significant deformity or atrophy Skin- no rash or lesion Psych- euthymic mood, full affect Neuro- strength and sensation are intact  EKG-  Sinus  rhythm Prior ekg from yesterday reveals LBB inferior axis VT with transition at V4  Labs:   Lab Results  Component Value Date   WBC 9.0 07/03/2018   HGB 12.7 07/03/2018   HCT 39.8 07/03/2018   MCV 89.0 07/03/2018   PLT 287 07/03/2018    Recent Labs  Lab 07/02/18 1817  07/03/18 0406  NA 137   < > 141  K 5.8*   < > 3.5  CL 104   < > 112*  CO2 21*  --  21*  BUN 13   < > 11  CREATININE 0.86   < > 0.80  CALCIUM 9.1  --  8.6*  PROT 7.5  --   --   BILITOT 1.7*  --   --   ALKPHOS 84  --   --   ALT 28  --   --   AST 42*  --   --   GLUCOSE 110*   < > 97   < > = values in this interval not displayed.   Lab Results  Component Value Date   TROPONINI <0.03 07/03/2018    Lab Results  Component Value Date   CHOL 157 07/03/2018   Lab Results  Component Value Date   HDL 42 07/03/2018   Lab Results  Component Value Date   LDLCALC 96 07/03/2018    Echo:  Pending  ASSESSMENT AND PLAN:   1. NSVT The patient has frequent salvos of ventricular tachycardia with symptoms.  I agree with Dr Purvis Sheffield that these are likely outflow tract in origin and appear benign.  Echo is pending.  She has no cardiac history and has not had syncope. I agree with beta blocker therapy and would avoid amiodarone.  Recs: 1. Echo 2. Nadolol 20mg  daily  Anticipate discharge tomorrow if echo is normal I will arrange follow-up with me in the office next week She could return to work on Tuesday if symptoms are improved No driving until resolved and cleared by me in the office  Electrophysiology team to see as needed while here. Please call with questions.    Hillis Range, MD 07/03/2018  11:08 AM

## 2018-07-03 NOTE — Progress Notes (Signed)
Progress Note  Patient Name: Rhonda Duncan Date of Encounter: 07/03/2018  Primary Cardiologist: No primary care provider on file.   Subjective   She is doing well this morning and denies chest pain, palpitations, leg swelling, and shortness of breath. Prior to this she would experience the sudden onset of dizziness during salvos of VT.  Inpatient Medications    Scheduled Meds: . enoxaparin (LOVENOX) injection  40 mg Subcutaneous Q24H  . sodium chloride flush  3 mL Intravenous Q12H   Continuous Infusions: . sodium chloride    . amiodarone 30 mg/hr (07/03/18 0231)   PRN Meds: sodium chloride, acetaminophen **OR** acetaminophen, sodium chloride flush   Vital Signs    Vitals:   07/03/18 0144 07/03/18 0232 07/03/18 0538 07/03/18 0806  BP: 106/78 116/68 128/76 132/77  Pulse:   76 66  Resp:      Temp:   98.1 F (36.7 C) 98.3 F (36.8 C)  TempSrc:   Oral Oral  SpO2:   96% 99%  Weight:   105.9 kg   Height:        Intake/Output Summary (Last 24 hours) at 07/03/2018 0830 Last data filed at 07/03/2018 0600 Gross per 24 hour  Intake 589.62 ml  Output 600 ml  Net -10.38 ml   Filed Weights   07/02/18 1804 07/02/18 2241 07/03/18 0538  Weight: 107 kg 105.6 kg 105.9 kg    Telemetry    Frequent PVC's, NSVT - Personally Reviewed  ECG    Initial ECG shows sinus rhythm with paroxysm of monomorphic VT with late R wave transition, left bundle branch block morphology, and inferior axis- Personally Reviewed  Physical Exam   GEN: No acute distress.   Neck: No JVD Cardiac: RRR, no murmurs, rubs, or gallops.  Respiratory: Clear to auscultation bilaterally. GI: Soft, nontender, non-distended  MS: No edema; No deformity. Neuro:  Nonfocal  Psych: Normal affect   Labs    Chemistry Recent Labs  Lab 07/02/18 1817 07/02/18 1819 07/03/18 0406  NA 137 140 141  K 5.8* 5.2* 3.5  CL 104 107 112*  CO2 21*  --  21*  GLUCOSE 110* 112* 97  BUN 13 17 11   CREATININE 0.86  0.70 0.80  CALCIUM 9.1  --  8.6*  PROT 7.5  --   --   ALBUMIN 4.1  --   --   AST 42*  --   --   ALT 28  --   --   ALKPHOS 84  --   --   BILITOT 1.7*  --   --   GFRNONAA >60  --  >60  GFRAA >60  --  >60  ANIONGAP 12  --  8     Hematology Recent Labs  Lab 07/02/18 1817 07/02/18 1819 07/03/18 0406  WBC 13.4*  --  9.0  RBC 4.94  --  4.47  HGB 14.5 15.0 12.7  HCT 46.0 44.0 39.8  MCV 93.1  --  89.0  MCH 29.4  --  28.4  MCHC 31.5  --  31.9  RDW 11.9  --  11.9  PLT 295  --  287    Cardiac Enzymes Recent Labs  Lab 07/02/18 2310 07/03/18 0406  TROPONINI <0.03 <0.03    Recent Labs  Lab 07/02/18 1818  TROPIPOC 0.00     BNPNo results for input(s): BNP, PROBNP in the last 168 hours.   DDimer No results for input(s): DDIMER in the last 168 hours.   Radiology  Dg Chest Portable 1 View  Result Date: 07/02/2018 CLINICAL DATA:  42 y/o  F; irregular heartbeat. EXAM: PORTABLE CHEST 1 VIEW COMPARISON:  None. FINDINGS: Normal cardiac silhouette given projection and technique. Transcutaneous pacing pads noted. Pulmonary venous hypertension. No consolidation, effusion, or pneumothorax. No acute osseous abnormality is evident. IMPRESSION: Pulmonary venous hypertension. No acute pulmonary process identified. Electronically Signed   By: Mitzi Hansen M.D.   On: 07/02/2018 18:47    Cardiac Studies   Echocardiogram is pending  Patient Profile     42 y.o. female no significant PMH who presents with episodes of SOB and lightheadedness, found to have an arrhythmia at outpatient office.   Assessment & Plan    1. 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 Structural heart disease needs to be ruled out and cardiac MRI could be  considered once echocardiogram is reviewed. Subsequent ECG showing sinus rhythm does not have characteristic features of arrhythmogenic right ventricular cardiomyopathy, in which case the patient's VT would be amenable to catheter ablation.  IV amiodarone started by admitting physician. However, I would consider switching to a metoprolol tartrate as beta blockers (and verapamil) are first line agents. As we do not know LV systolic function at this point, I would avoid verapamil initially.  Lipids, HbA1C, troponins, and CBC are normal.  I will ask EP to consult.    For questions or updates, please contact [facility] Please consult [email or web address] for contact info under Cardiology/STEMI.      Signed, Prentice Docker, MD  07/03/2018, 8:30 AM

## 2018-07-04 ENCOUNTER — Encounter (HOSPITAL_COMMUNITY): Payer: Self-pay

## 2018-07-04 MED ORDER — NADOLOL 20 MG PO TABS: 20.0000 mg | ORAL_TABLET | Freq: Every day | ORAL | 5 refills | Status: DC

## 2018-07-04 NOTE — Progress Notes (Signed)
D/c order. Reviewed d/c instructions, pt verbalized understanding.  IV removed without problem.  All belongings returned.  D/ced via [facility] with friend.

## 2018-07-04 NOTE — Discharge Summary (Signed)
Discharge Summary    Patient ID: Rhonda Duncan,  MRN: [id number], DOB/AGE: 1975/12/31 42 y.o.  Admit date: 07/02/2018 Discharge date: 07/04/2018  Primary Care Provider: Gweneth Dimitri Primary Cardiologist: Hillis Range, MD   Discharge Diagnoses    Active Problems:   Ventricular tachycardia [facility])   History of Present Illness     Rhonda Duncan is 42 y.o. female with no significant past medical history who presented to [facility] ED on 07/02/2018 for intermittent dyspnea and dizziness lasting for seconds at a time over the past week.   The day of admission she had a presyncopal episode and her husband checked her pulse and noted that it was irregular, therefore she came to the ED for further evaluation.   Initial labs showed Na+ 137, K+ 5.8, TSH 1.637, and Mg 1.9. Initial and cyclic troponin values were negative. Her initial EKG and telemetry showed frequent PVC's and runs of NSVT, therefore she was started on IV Amiodarone and admitted for further testing.   Hospital Course     Consultants: Electrophysiology  On 07/03/2018, she denied any recurrent chest pain or palpitations but did report associated dizziness with episodes of VT. Upon further review of her EKG, features were thought to be most consistent with right ventricular outflow tract origin as she had paroxysms of monomorphic VT with late R-wave transition, LBBB, and inferior axis. Therefore, Amiodarone was discontinued and she was started on BB therapy. EP was consulted and agreed with this assessment, therefore she was started on Nadolol 20mg  daily with close EP follow-up as an outpatient. Was advised not to drive until evaluated at outpatient follow-up.  An echocardiogram was performed during admission and showed EF was mildly reduced at 45-50% with diffuse HK and mild MR. On 07/04/2018, she was examined by Dr. Purvis Sheffield and denied any recurrent symptoms. Was felt to be stable for discharge with  close-outpatient follow-up with EP recommended. A staff message was sent to the office to arrange for an appointment. Was discharged home in stable condition.   _____________  Discharge Vitals Blood pressure (!) 123/92, pulse 63, temperature 98.2 F (36.8 C), temperature source Oral, resp. rate 18, height 5\' 6"  (1.676 m), weight 106.3 kg, last menstrual period 06/24/2018, SpO2 98 %.  Filed Weights   07/02/18 2241 07/03/18 0538 07/04/18 0332  Weight: 105.6 kg 105.9 kg 106.3 kg    Labs & Radiologic Studies     CBC Recent Labs    07/02/18 1817 07/02/18 1819 07/03/18 0406  WBC 13.4*  --  9.0  NEUTROABS 9.9*  --   --   HGB 14.5 15.0 12.7  HCT 46.0 44.0 39.8  MCV 93.1  --  89.0  PLT 295  --  287   Basic Metabolic Panel Recent Labs    11/91/47 1817 07/02/18 1819 07/03/18 0406  NA 137 140 141  K 5.8* 5.2* 3.5  CL 104 107 112*  CO2 21*  --  21*  GLUCOSE 110* 112* 97  BUN 13 17 11   CREATININE 0.86 0.70 0.80  CALCIUM 9.1  --  8.6*  MG 1.9  --   --    Liver Function Tests Recent Labs    07/02/18 1817  AST 42*  ALT 28  ALKPHOS 84  BILITOT 1.7*  PROT 7.5  ALBUMIN 4.1   No results for input(s): LIPASE, AMYLASE in the last 72 hours. Cardiac Enzymes Recent Labs    07/02/18 2310 07/03/18 0406  TROPONINI <0.03 <0.03   BNP  Invalid input(s): POCBNP D-Dimer No results for input(s): DDIMER in the last 72 hours. Hemoglobin A1C Recent Labs    07/03/18 0406  HGBA1C 5.5   Fasting Lipid Panel Recent Labs    07/03/18 0406  CHOL 157  HDL 42  LDLCALC 96  TRIG 94  CHOLHDL 3.7   Thyroid Function Tests Recent Labs    07/02/18 1815  TSH 1.637    Dg Chest Portable 1 View  Result Date: 07/02/2018 CLINICAL DATA:  42 y/o  F; irregular heartbeat. EXAM: PORTABLE CHEST 1 VIEW COMPARISON:  None. FINDINGS: Normal cardiac silhouette given projection and technique. Transcutaneous pacing pads noted. Pulmonary venous hypertension. No consolidation, effusion, or  pneumothorax. No acute osseous abnormality is evident. IMPRESSION: Pulmonary venous hypertension. No acute pulmonary process identified. Electronically Signed   By: Mitzi Hansen M.D.   On: 07/02/2018 18:47     Diagnostic Studies/Procedures     Echocardiogram: 07/03/2018 Study Conclusions  - Left ventricle: The cavity size was at the upper limits of   normal. Wall thickness was normal. Systolic function was low   normal to mildly reduced. The estimated ejection fraction was in   the range of 45% to 50%. Diffuse hypokinesis. Left ventricular   diastolic function parameters were normal. - Mitral valve: There was mild regurgitation. - Left atrium: The atrium was mildly dilated.     Disposition   Pt is being discharged home today in good condition.  Follow-up Plans & Appointments    Follow-up Information    Hillis Range, MD Follow up.   Specialty:  Cardiology Why:  The office will call you within 1-2 business days to arrange follow-up. If you do not hear from them within this time frame, please call the number provided.  Contact information: [address] [place] [address] [phone]            Discharge Medications     Medication List    TAKE these medications   acetaminophen 325 MG tablet Commonly known as:  TYLENOL Take 325-650 mg by mouth every 8 (eight) hours as needed for mild pain or headache.   FIBER ADULT GUMMIES PO Take 2 tablets by mouth daily.   ibuprofen 200 MG tablet Commonly known as:  ADVIL,MOTRIN Take 200-400 mg by mouth every 8 (eight) hours as needed (for pain or discomfort).   nadolol 20 MG tablet Commonly known as:  CORGARD Take 1 tablet (20 mg total) by mouth daily. Start taking on:  07/05/2018         Allergies No Known Allergies   Acute coronary syndrome (MI, NSTEMI, STEMI, etc) this admission?: No.    Outstanding Labs/Studies   None.   Duration of Discharge Encounter   Greater than 30  minutes including physician time.  Signed, Ellsworth Lennox, PA-C 07/04/2018, 10:33 AM

## 2018-07-04 NOTE — Discharge Instructions (Addendum)
Ventricular Tachycardia Ventricular tachycardia is a fast heartbeat that begins in the lower chambers of the heart (ventricles). It is a type of abnormal heart rhythm (arrhythmia). A normal heartbeat usually starts when an area in the heart called the sinoatrial (SA) node releases an electrical signal. With ventricular tachycardia, electrical signals in the lower part of the heart fire abnormally and interfere with the electrical signals sent out by the SA node. A normal heart rate is 60-100 beats per minute. During an episode of ventricular tachycardia, the heart reaches 100 beats per minute or higher. This condition can be life-threatening and should be treated immediately. What are the causes? This condition is caused by abnormal electrical activity in the lower part of the heart. This may result from:  Medicines.f  Diseases of the heart muscle (cardiomyopathy).  The heart not getting enough oxygen. This may be caused by blood flow problems in the arteries.  An inflammatory disease that affects multiple areas of the body (sarcoidosis).  Drug use, such as cocaine, amphetamine, or anabolic steroid use.  What increases the risk? You are more likely to develop this condition if:  You have had a heart attack.  You have: ? Heart failure or cardiomyopathy. ? Heart defects that you were born with (congenital heart defects). ? Abnormal heart tissue. ? Heart valves that leak or are narrow. ? Diabetes. ? An infection that affects the heart. ? High blood pressure. ? An overactive or underactive thyroid. ? Sleep apnea. ? A family history of stopped heartbeat (cardiac arrest) or coronary artery disease. ? High cholesterol.  You smoke.  You drink alcohol heavily.  You use drugs, such as cocaine.  What are the signs or symptoms? Symptoms of this condition include:  A pounding heartbeat.  Feeling as if your heart is skipping beats or fluttering (palpitations).  Shortness of  breath.  Anxiety.  Dizziness.  Light-headedness.  Fainting.  Chest pain.  Cardiac arrest caused by an irregular heartbeat (arrhythmia).  How is this diagnosed? This condition may be diagnosed based on:  Your symptoms and medical history.  A physical exam.  Electrocardiogram (ECG). This test is done to check for problems with electrical activity in the heart.  Holter monitor or event monitor test. This test involves wearing a portable device that monitors your heart rate over time.  You may also have other tests, including:  Blood tests.  Chest X-ray.  Echocardiogram. This test involves using sound waves to create images of the heart.  Angiogram. During this test, dye is injected into your bloodstream, and then X-rays are taken. The dye lets your health care provider see how blood flows through your arteries.  Exercise stress test. During this test, an ECG is done while you exercise on a treadmill.  Cardiac CT scan or cardiac MRI.  How is this treated? Treatment for this condition depends on the cause. Treatment may include:  Medicines that slow the heart rate and return it to a normal rhythm (anti-arrhythmics).  An electric shock (cardioversion) that makes the heart go back to a normal rhythm.  An electrophysiology study. This procedure can help locate areas of heart tissue that are causing rapid heartbeats. ? In this procedure, a thin, flexible tube (catheter) is inserted into one of your veins and moved to your heart to evaluate your heart's electrical activity. ? In some cases, the heart tissue that is causing problems may be killed with radiofrequency energy delivered through the catheter (radiofrequency ablation). This may help your heart  keep a normal rhythm.  An implantable cardioverter defibrillator (ICD). This is a small device that monitors your heartbeat. When it senses an irregular heartbeat, it sends a shock to bring the heartbeat back to normal. The ICD  is implanted under the skin in the chest.  Surgery to improve blood flow to the heart.  Genetic counseling to check whether your family members are at risk for ventricular tachycardia.  Follow these instructions at home: Lifestyle  Do not use any products that contain nicotine or tobacco, such as cigarettes and e-cigarettes. If you need help quitting, ask your health care provider.  Do not use stimulant drugs, such as cocaine or methamphetamines.  Maintain a healthy weight.  Manage stress. Try to do this with relaxation exercises, yoga, quiet time, or meditation. Eating and drinking  Eat a healthy diet. This includes plenty of fruits and vegetables, whole grains, lean meats, and low-fat or fat-free dairy products.  Avoid eating foods that are high in saturated fat, trans fat, sugar, or salt (sodium).  Ask your health care provider if you may drink alcohol. ? If alcohol triggers episodes of ventricular tachycardia, do not drink alcohol. ? If alcohol does not trigger episodes, limit alcohol intake to no more than 1 drink a day for nonpregnant women and 2 drinks a day for men. One drink equals 12 oz of beer, 5 oz of wine, or 1 oz of hard liquor. General instructions  Take over-the-counter and prescription medicines only as told by your health care provider.  Exercise regularly. Aim for 150 minutes of moderate exercise or 75 minutes of vigorous exercise per week. Ask your health care provider what exercises are safe for you.  Keep all follow-up visits as told by your health care provider. This is important. Contact a health care provider if:  Your symptoms get worse.  You develop new symptoms, such as new palpitations.  You feel depressed. Get help right away if:  You have an episode of ventricular tachycardia that lasts 30 seconds or more.  You have chest pain.  You have trouble breathing. These symptoms may represent a serious problem that is an emergency. Do not wait to  see if the symptoms will go away. Get medical help right away. Call your local emergency services (911 in the [place] Do not drive yourself to the hospital. Summary  Ventricular tachycardia is a fast heartbeat that begins in the lower chambers of the heart. This condition can be life-threatening and should be treated immediately.  This condition may be treated with medicines, electric shock, radiofrequency energy, surgery, or insertion of an implantable cardioverter defibrillator (ICD).  Get help right away if you have chest pain, trouble breathing, or ventricular tachycardia symptoms that last more than 30 seconds. This information is not intended to replace advice given to you by your health care provider. Make sure you discuss any questions you have with your health care provider. Document Released: 10/02/2016 Document Revised: 10/02/2016 Document Reviewed: 10/02/2016 [employer] Interactive Patient Education  2018 [employer] DO NOT DRIVE UNTIL YOU HAVE BEEN CLEARED TO DO SO BY CARDIOLOGY AS AN OUTPATIENT.

## 2018-07-04 NOTE — Progress Notes (Signed)
Progress Note  Patient Name: Rhonda Duncan Date of Encounter: 07/04/2018  Primary Cardiologist: Hillis Range, MD   Subjective   The patient denies any symptoms of chest pain, palpitations, shortness of breath, lightheadedness, dizziness, leg swelling, orthopnea, PND, and syncope.   Inpatient Medications    Scheduled Meds: . enoxaparin (LOVENOX) injection  40 mg Subcutaneous Q24H  . nadolol  20 mg Oral Daily  . sodium chloride flush  3 mL Intravenous Q12H   Continuous Infusions: . sodium chloride     PRN Meds: sodium chloride, acetaminophen **OR** acetaminophen, sodium chloride flush   Vital Signs    Vitals:   07/03/18 1644 07/03/18 2015 07/04/18 0028 07/04/18 0332  BP: 126/76 123/77 112/69 118/75  Pulse: 66 (!) 57 61   Resp:   20 18  Temp: 97.8 F (36.6 C) 97.9 F (36.6 C)  97.8 F (36.6 C)  TempSrc: Oral Oral  Oral  SpO2: 99% 98% 98%   Weight:    106.3 kg  Height:        Intake/Output Summary (Last 24 hours) at 07/04/2018 0846 Last data filed at 07/03/2018 1941 Gross per 24 hour  Intake 435.29 ml  Output -  Net 435.29 ml   Filed Weights   07/02/18 2241 07/03/18 0538 07/04/18 0332  Weight: 105.6 kg 105.9 kg 106.3 kg    Telemetry    Sinus rhythm with 5 and beat runs of NSVT prior to beta blocker, none afterwards - Personally Reviewed  ECG    No new tracings - Personally Reviewed  Physical Exam   GEN: No acute distress.   Neck: No JVD Cardiac: RRR, no murmurs, rubs, or gallops.  Respiratory: Clear to auscultation bilaterally. GI: Soft, nontender, non-distended  MS: No edema; No deformity. Neuro:  Nonfocal  Psych: Normal affect   Labs    Chemistry Recent Labs  Lab 07/02/18 1817 07/02/18 1819 07/03/18 0406  NA 137 140 141  K 5.8* 5.2* 3.5  CL 104 107 112*  CO2 21*  --  21*  GLUCOSE 110* 112* 97  BUN 13 17 11   CREATININE 0.86 0.70 0.80  CALCIUM 9.1  --  8.6*  PROT 7.5  --   --   ALBUMIN 4.1  --   --   AST 42*  --   --     ALT 28  --   --   ALKPHOS 84  --   --   BILITOT 1.7*  --   --   GFRNONAA >60  --  >60  GFRAA >60  --  >60  ANIONGAP 12  --  8     Hematology Recent Labs  Lab 07/02/18 1817 07/02/18 1819 07/03/18 0406  WBC 13.4*  --  9.0  RBC 4.94  --  4.47  HGB 14.5 15.0 12.7  HCT 46.0 44.0 39.8  MCV 93.1  --  89.0  MCH 29.4  --  28.4  MCHC 31.5  --  31.9  RDW 11.9  --  11.9  PLT 295  --  287    Cardiac Enzymes Recent Labs  Lab 07/02/18 2310 07/03/18 0406  TROPONINI <0.03 <0.03    Recent Labs  Lab 07/02/18 1818  TROPIPOC 0.00     BNPNo results for input(s): BNP, PROBNP in the last 168 hours.   DDimer No results for input(s): DDIMER in the last 168 hours.   Radiology    Dg Chest Portable 1 View  Result Date: 07/02/2018 CLINICAL DATA:  42 y/o  F; irregular heartbeat. EXAM: PORTABLE CHEST 1 VIEW COMPARISON:  None. FINDINGS: Normal cardiac silhouette given projection and technique. Transcutaneous pacing pads noted. Pulmonary venous hypertension. No consolidation, effusion, or pneumothorax. No acute osseous abnormality is evident. IMPRESSION: Pulmonary venous hypertension. No acute pulmonary process identified. Electronically Signed   By: Mitzi Hansen M.D.   On: 07/02/2018 18:47    Cardiac Studies   Echo (07/03/18):  Study Conclusions  - Left ventricle: The cavity size was at the upper limits of   normal. Wall thickness was normal. Systolic function was low   normal to mildly reduced. The estimated ejection fraction was in   the range of 45% to 50%. Diffuse hypokinesis. Left ventricular   diastolic function parameters were normal. - Mitral valve: There was mild regurgitation. - Left atrium: The atrium was mildly dilated.  Patient Profile     42 y.o. female with no significant PMH who presents with episodes of SOB and lightheadedness, found to have an arrhythmia at outpatient office.   Assessment & Plan    1. 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 Echocardiogram shows low normal LV systolic function, LVEF around 50% (personally reviewed).  This may be due to frequent PVC's/salvos of RVOT VT (high burden, perhaps > 15% leading to a cardiomyopathy). 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  Subsequent ECG showing sinus rhythm does not have characteristic features of arrhythmogenic right ventricular cardiomyopathy, in which case the patient's VT would be amenable to catheter ablation. Now on nadolol. Will follow up with EP as outpatient.  Dispo: Stable for discharge. I will arrange for follow up with Dr. Johney Frame.  For questions or updates, please contact [facility] Please consult [email or web address] for contact info under Cardiology/STEMI.      Signed, Prentice Docker, MD  07/04/2018, 8:46 AM

## 2018-07-07 ENCOUNTER — Encounter: Payer: Self-pay | Admitting: Internal Medicine

## 2018-07-07 ENCOUNTER — Ambulatory Visit: Payer: BC Managed Care – PPO | Admitting: Internal Medicine

## 2018-07-07 VITALS — BP 104/70 | HR 50 | Ht 66.0 in | Wt 233.2 lb

## 2018-07-07 DIAGNOSIS — I472 Ventricular tachycardia, unspecified: Secondary | ICD-10-CM

## 2018-07-07 MED ORDER — NADOLOL 20 MG PO TABS: 20.0000 mg | ORAL_TABLET | Freq: Every day | ORAL | 3 refills | Status: DC

## 2018-07-07 NOTE — Progress Notes (Signed)
   PCP: Gweneth DimitriMcNeill, Wendy, MD Primary Cardiologist: Dr Purvis SheffieldKoneswaran Primary EP: Dr Dyanne IhaAllred  Rhonda Duncan is a 42 y.o. female who presents today for routine electrophysiology followup.  Since her hospital discharge, the patient reports doing very well.  Today, she denies symptoms of palpitations, chest pain, shortness of breath,  lower extremity edema, dizziness, presyncope, or syncope.  The patient is otherwise without complaint today.   Past Medical History:  Diagnosis Date  . Ventricular tachycardia (HCC)    History reviewed. No pertinent surgical history.  ROS- all systems are reviewed and negatives except as per HPI above  Current Outpatient Medications  Medication Sig Dispense Refill  . acetaminophen (TYLENOL) 325 MG tablet Take 325-650 mg by mouth every 8 (eight) hours as needed for mild pain or headache.     Marland Kitchen. FIBER ADULT GUMMIES PO Take 2 tablets by mouth daily.    Marland Kitchen. ibuprofen (ADVIL,MOTRIN) 200 MG tablet Take 200-400 mg by mouth every 8 (eight) hours as needed (for pain or discomfort).     . nadolol (CORGARD) 20 MG tablet Take 1 tablet (20 mg total) by mouth daily. 30 tablet 5   No current facility-administered medications for this visit.     Physical Exam: Vitals:   07/07/18 0950  BP: 104/70  Pulse: (!) 50  SpO2: 97%  Weight: 233 lb 3.2 oz (105.8 kg)  Height: 5\' 6"  (1.676 m)    GEN- The patient is well appearing, alert and oriented x 3 today.   Head- normocephalic, atraumatic Eyes-  Sclera clear, conjunctiva pink Ears- hearing intact Oropharynx- clear Lungs- Clear to ausculation bilaterally, normal work of breathing Heart- Regular rate and rhythm, no murmurs, rubs or gallops, PMI not laterally displaced GI- soft, NT, ND, + BS Extremities- no clubbing, cyanosis, or edema  Wt Readings from Last 3 Encounters:  07/07/18 233 lb 3.2 oz (105.8 kg)  07/04/18 234 lb 6.4 oz (106.3 kg)    EKG tracing ordered today is personally reviewed and shows sinus rhythm 50  bpm, PR 158 msec, QRS 88 msec, QTc 421 msec  Assessment and Plan:  1. NSVT, PVCs LBB inferior axis VT with transition at V4, likely due to RVOT VT. Echo reveals normal RV and LV size, function Doing very well currently No changes today  Ok to drive as long as episodes do not return Return to work on Monday  Return to see me in 4 weeks  Hillis RangeJames Jenene Kauffmann MD, [facility] Center[facility] 07/07/2018 10:28 AM

## 2018-07-07 NOTE — Patient Instructions (Addendum)
Medication Instructions:  Your physician recommends that you continue on your current medications as directed. Please refer to the Current Medication list given to you today.  Labwork: None ordered.  Testing/Procedures: None ordered.  Follow-Up: Your physician wants you to follow-up in: 4 weeks with Dr. Allred.    Any Other Special Instructions Will Be Listed Below (If Applicable).  If you need a refill on your cardiac medications before your next appointment, please call your pharmacy.    

## 2018-07-08 ENCOUNTER — Encounter: Payer: Self-pay | Admitting: Internal Medicine

## 2018-07-20 ENCOUNTER — Telehealth: Payer: Self-pay | Admitting: Internal Medicine

## 2018-07-20 NOTE — Telephone Encounter (Signed)
New message    Pt c/o medication issue:  1. Name of Medication: nadolol   2. How are you currently taking this medication (dosage and times per day)? 20 mg  3. Are you having a reaction (difficulty breathing--STAT)?   4. What is your medication issue? Pt has a question about medication. How low should hr be? She said her watch says that her resting hr is 50 and sometimes low as 40. She said she has been really tired and if that could have anything to do with her hr.

## 2018-07-20 NOTE — Telephone Encounter (Signed)
See MyChart documentation.  Closing as duplicate

## 2018-08-05 ENCOUNTER — Ambulatory Visit: Payer: BC Managed Care – PPO | Admitting: Internal Medicine

## 2018-08-05 ENCOUNTER — Encounter: Payer: Self-pay | Admitting: Internal Medicine

## 2018-08-05 VITALS — BP 110/68 | HR 61 | Ht 66.0 in | Wt 234.2 lb

## 2018-08-05 DIAGNOSIS — R000 Tachycardia, unspecified: Secondary | ICD-10-CM

## 2018-08-05 DIAGNOSIS — I493 Ventricular premature depolarization: Secondary | ICD-10-CM | POA: Diagnosis not present

## 2018-08-05 DIAGNOSIS — I472 Ventricular tachycardia: Secondary | ICD-10-CM

## 2018-08-05 DIAGNOSIS — I4729 Other ventricular tachycardia: Secondary | ICD-10-CM

## 2018-08-05 NOTE — Progress Notes (Signed)
   PCP: Gweneth DimitriMcNeill, Wendy, MD Primary Cardiologist: Dr Purvis SheffieldKoneswaran Primary EP: Dr Dyanne IhaAllred  Rhonda Duncan is a 42 y.o. female who presents today for routine electrophysiology followup.  Since last being seen in our clinic, the patient reports doing very well. She is active but not exercising.  She has had chest discomfort, related to eating.  denies exertional symptoms. Today, she denies symptoms of palpitations,  shortness of breath,  lower extremity edema, dizziness, presyncope, or syncope.  The patient is otherwise without complaint today.   Past Medical History:  Diagnosis Date  . Ventricular tachycardia (HCC)    No past surgical history on file.  ROS- all systems are reviewed and negatives except as per HPI above  Current Outpatient Medications  Medication Sig Dispense Refill  . acetaminophen (TYLENOL) 325 MG tablet Take 325-650 mg by mouth every 8 (eight) hours as needed for mild pain or headache.     Marland Kitchen. FIBER ADULT GUMMIES PO Take 2 tablets by mouth daily.    Marland Kitchen. ibuprofen (ADVIL,MOTRIN) 200 MG tablet Take 200-400 mg by mouth every 8 (eight) hours as needed (for pain or discomfort).     . nadolol (CORGARD) 20 MG tablet Take 1 tablet (20 mg total) by mouth daily. 90 tablet 3   No current facility-administered medications for this visit.     Physical Exam: Vitals:   08/05/18 1401  BP: 110/68  Pulse: 61  SpO2: 99%  Weight: 234 lb 3.2 oz (106.2 kg)  Height: 5\' 6"  (1.676 m)    GEN- The patient is well appearing, alert and oriented x 3 today.   Head- normocephalic, atraumatic Eyes-  Sclera clear, conjunctiva pink Ears- hearing intact Oropharynx- clear Lungs- Clear to ausculation bilaterally, normal work of breathing Heart- Regular rate and rhythm, no murmurs, rubs or gallops, PMI not laterally displaced GI- soft, NT, ND, + BS Extremities- no clubbing, cyanosis, or edema  Wt Readings from Last 3 Encounters:  08/05/18 234 lb 3.2 oz (106.2 kg)  07/07/18 233 lb 3.2 oz (105.8  kg)  07/04/18 234 lb 6.4 oz (106.3 kg)    EKG tracing ordered today is personally reviewed and shows sinus rhythm  Assessment and Plan:  1. NSVT, PVCs Doing well at this time No further changes Will order ETT  (on nadolol) to evaluate for exercise induced arrhythmias and to further risk stratify chest pain  2. Mildly reduced EF Likely due to arrhythmia Consider repeating echo upon return  Return in 2 months  Hillis RangeJames Braelyn Bordonaro MD, [facility] Center[facility] 08/05/2018 2:11 PM

## 2018-08-05 NOTE — Patient Instructions (Addendum)
Medication Instructions:  Your physician recommends that you continue on your current medications as directed. Please refer to the Current Medication list given to you today.  Labwork: None ordered.  Testing/Procedures: Your physician has requested that you have an exercise tolerance test. For further information please visit [email or web address]. Please also follow instruction sheet, as given.  Please schedule for a treadmill stress test.  Follow-Up: Your physician wants you to follow-up in: 2 months with Dr. Johney FrameAllred.      Any Other Special Instructions Will Be Listed Below (If Applicable).  If you need a refill on your cardiac medications before your next appointment, please call your pharmacy.

## 2018-08-24 ENCOUNTER — Ambulatory Visit (INDEPENDENT_AMBULATORY_CARE_PROVIDER_SITE_OTHER): Payer: BC Managed Care – PPO

## 2018-08-24 DIAGNOSIS — I493 Ventricular premature depolarization: Secondary | ICD-10-CM | POA: Diagnosis not present

## 2018-08-24 DIAGNOSIS — R000 Tachycardia, unspecified: Secondary | ICD-10-CM

## 2018-08-24 DIAGNOSIS — I472 Ventricular tachycardia: Secondary | ICD-10-CM | POA: Diagnosis not present

## 2018-08-24 DIAGNOSIS — I4729 Other ventricular tachycardia: Secondary | ICD-10-CM

## 2018-08-24 LAB — EXERCISE TOLERANCE TEST
CHL CUP MPHR: 176 {beats}/min
CHL CUP RESTING HR STRESS: 74 {beats}/min
CSEPPHR: 127 {beats}/min
Estimated workload: 3.1 METS
Exercise duration (min): 1 min
Exercise duration (sec): 12 s
Percent HR: 72 %

## 2018-09-08 ENCOUNTER — Other Ambulatory Visit: Payer: Self-pay | Admitting: Internal Medicine

## 2018-09-08 ENCOUNTER — Ambulatory Visit: Payer: BC Managed Care – PPO | Admitting: Internal Medicine

## 2018-09-08 ENCOUNTER — Encounter (INDEPENDENT_AMBULATORY_CARE_PROVIDER_SITE_OTHER): Payer: BC Managed Care – PPO

## 2018-09-08 ENCOUNTER — Encounter: Payer: Self-pay | Admitting: Internal Medicine

## 2018-09-08 VITALS — BP 132/84 | HR 55 | Ht 66.0 in | Wt 233.4 lb

## 2018-09-08 DIAGNOSIS — I472 Ventricular tachycardia, unspecified: Secondary | ICD-10-CM

## 2018-09-08 DIAGNOSIS — I493 Ventricular premature depolarization: Secondary | ICD-10-CM

## 2018-09-08 DIAGNOSIS — I4729 Other ventricular tachycardia: Secondary | ICD-10-CM

## 2018-09-08 NOTE — Progress Notes (Signed)
   PCP: Gweneth Dimitri, MD Primary Cardiologist: Dr Purvis Sheffield Primary EP: Dr Dyanne Iha Rhonda Duncan is a 43 y.o. female who presents today for routine electrophysiology followup.  Since last being seen in our clinic, the patient reports doing very well.  She has had some atypical chest pains.  She is very worried about these.  She has rare palpitations.  Today, she denies symptoms of shortness of breath,  lower extremity edema, dizziness, presyncope, or syncope.  The patient is otherwise without complaint today.   Past Medical History:  Diagnosis Date  . Ventricular tachycardia (HCC)    History reviewed. No pertinent surgical history.  ROS- all systems are reviewed and negatives except as per HPI above  Current Outpatient Medications  Medication Sig Dispense Refill  . acetaminophen (TYLENOL) 325 MG tablet Take 325-650 mg by mouth every 8 (eight) hours as needed for mild pain or headache.     . esomeprazole (NEXIUM) 20 MG packet Take 20 mg by mouth daily.    Marland Kitchen FIBER ADULT GUMMIES PO Take 2 tablets by mouth daily.    Marland Kitchen ibuprofen (ADVIL,MOTRIN) 200 MG tablet Take 200-400 mg by mouth every 8 (eight) hours as needed (for pain or discomfort).     . nadolol (CORGARD) 20 MG tablet Take 1 tablet (20 mg total) by mouth daily. 90 tablet 3   No current facility-administered medications for this visit.     Physical Exam: Vitals:   09/08/18 1500  BP: 132/84  Pulse: (!) 55  SpO2: 98%  Weight: 233 lb 6.4 oz (105.9 kg)  Height: 5\' 6"  (1.676 m)    GEN- The patient is well appearing, alert and oriented x 3 today.   Head- normocephalic, atraumatic Eyes-  Sclera clear, conjunctiva pink Ears- hearing intact Oropharynx- clear Lungs- Clear to ausculation bilaterally, normal work of breathing Heart- Regular rate and rhythm, no murmurs, rubs or gallops, PMI not laterally displaced GI- soft, NT, ND, + BS Extremities- no clubbing, cyanosis, or edema  Wt Readings from Last 3 Encounters:    09/08/18 233 lb 6.4 oz (105.9 kg)  08/05/18 234 lb 3.2 oz (106.2 kg)  07/07/18 233 lb 3.2 oz (105.8 kg)    EKG tracing ordered today is personally reviewed and shows sinus rhythm 55 bpm, no PVCs  Recent ETT reviewed  Assessment and Plan:  1. NSVT, PVCs Doing well  Recent ETT revealed exercise induced PVCs, likely due to RVOT VT Normal echo  I am going to repeat treatmill today personally to evaluate her arrhythmia better.  Risk of procedure discussed with patient.  Hillis Range MD, [facility] 09/08/2018 3:08 PM    Addendum: Pt exercised for 8 minutes, achieved target heart rate without any PVCs or NSVT.  No ischemic changes.   She did have an elevation in BP observed.  Regular exercise encouraged. Lifestyle modification for BP management.  Return to see me in 2 months.

## 2018-09-08 NOTE — Patient Instructions (Addendum)
Medication Instructions:  Your physician recommends that you continue on your current medications as directed. Please refer to the Current Medication list given to you today.  Labwork: None ordered.  Testing/Procedures: None ordered.  Follow-Up: Your physician wants you to follow-up in: 2 months with Dr. Allred.      Any Other Special Instructions Will Be Listed Below (If Applicable).  If you need a refill on your cardiac medications before your next appointment, please call your pharmacy.   

## 2018-09-10 LAB — EXERCISE TOLERANCE TEST
CHL CUP MPHR: 176 {beats}/min
CSEPHR: 93 %
CSEPPHR: 164 {beats}/min
Estimated workload: 10.1 METS
Exercise duration (min): 8 min
Exercise duration (sec): 0 s
RPE: 16
Rest HR: 70 {beats}/min

## 2018-10-06 ENCOUNTER — Ambulatory Visit: Payer: BC Managed Care – PPO | Admitting: Internal Medicine

## 2018-11-11 ENCOUNTER — Ambulatory Visit: Payer: BC Managed Care – PPO | Admitting: Internal Medicine

## 2018-11-11 ENCOUNTER — Other Ambulatory Visit: Payer: Self-pay

## 2018-11-11 ENCOUNTER — Telehealth (INDEPENDENT_AMBULATORY_CARE_PROVIDER_SITE_OTHER): Payer: BC Managed Care – PPO | Admitting: Internal Medicine

## 2018-11-11 DIAGNOSIS — I493 Ventricular premature depolarization: Secondary | ICD-10-CM | POA: Diagnosis not present

## 2018-11-11 DIAGNOSIS — I472 Ventricular tachycardia: Secondary | ICD-10-CM | POA: Diagnosis not present

## 2018-11-11 DIAGNOSIS — I4729 Other ventricular tachycardia: Secondary | ICD-10-CM

## 2018-11-11 NOTE — Progress Notes (Signed)
Electrophysiology TeleHealth Note   Due to national recommendations of social distancing due to COVID 19, Audio/video telehealth visit is felt to be most appropriate for this patient at this time.  See MyChart message from today for the patient's consent to telehealth for [facility].   Date:  11/11/2018   ID:  Rhonda Duncan, DOB [birth year], MRN [id number]  Location: home  Provider location: [address], [place] Evaluation Performed: Follow-up visit  PCP:  Gweneth Dimitri, MD   Primary Electrophysiologist: Hillis Range, MD    CC: palpitations    Rhonda Duncan is a 43 y.o. female who presents via audio/video conferencing for a telehealth visit today.  Since last being seen in our clinic, the patient reports doing very well.  Her palpitations were much improved.  Over the past 2 weeks, with stress of school being cancelled her palpitations.  She finds currently that she is having brief palpitations several times per week, typically lasting several seconds.  Stress seems to trigger episodes.  Episodes are associated with brief dizziness.  Today, she denies symptoms of chest pain, shortness of breath,  lower extremity edema, dizziness, presyncope, or syncope.  The patient is otherwise without complaint today.  The patient denies symptoms of fevers, chills, cough, or new SOB worrisome for COVID 19.   Past Medical History:  Diagnosis Date  . Ventricular tachycardia (HCC)    No past surgical history on file.   Current Outpatient Medications  Medication Sig Dispense Refill  . pantoprazole (PROTONIX) 40 MG tablet Take 40 mg by mouth daily.    Marland Kitchen acetaminophen (TYLENOL) 325 MG tablet Take 325-650 mg by mouth every 8 (eight) hours as needed for mild pain or headache.     Marland Kitchen FIBER ADULT GUMMIES PO Take 2 tablets by mouth daily.    Marland Kitchen ibuprofen (ADVIL,MOTRIN) 200 MG tablet Take 200-400 mg by mouth every 8 (eight) hours as needed (for pain or discomfort).     .  nadolol (CORGARD) 20 MG tablet Take 1 tablet (20 mg total) by mouth daily. 90 tablet 3   No current facility-administered medications for this visit.     Allergies:   Patient has no known allergies.   Social History:  The patient  reports that she has never smoked. She has never used smokeless tobacco. She reports current alcohol use. She reports that she does not use drugs.   Family History:  The patient's  family history includes Heart attack in her paternal grandfather; Heart disease in her father.    ROS:  Please see the history of present illness.   All other systems are personally reviewed and negative.   Exam: Well appearing, alert and conversant,  She is clearly stressed and tearful today, regular work of breathing,  good skin color  Recent Labs: 07/02/2018: ALT 28; Magnesium 1.9; TSH 1.637 07/03/2018: BUN 11; Creatinine, Ser 0.80; Hemoglobin 12.7; Platelets 287; Potassium 3.5; Sodium 141  personally reviewed   Wt Readings from Last 3 Encounters:  09/08/18 233 lb 6.4 oz (105.9 kg)  08/05/18 234 lb 3.2 oz (106.2 kg)  07/07/18 233 lb 3.2 oz (105.8 kg)      Other studies personally reviewed: Additional studies/ records that were reviewed today include: my prior visit  Review of the above records today demonstrates: as above Prior radiographs: not pertitent  The patient presents wearable device technology report for my review today. On my review, the patient presents with Apple Watch.  She does not  have tracings for me to review today.  ASSESSMENT AND PLAN:  1.  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  I do not feel that medicine change is advised at this time.  We discussed importance of regular exercise and stress reduction today.  2. COVID 19 screen The patient denies symptoms of COVID 19 at this time.  The importance of social distancing was discussed today.  3. Anxiety/ stress Related to COVID 19.  We discussed this at length  today, including coping mechanisms.  She does not feel that SSRI is currently warranted.  She is aware that I am available for further discussions if needed.  Follow-up:  telehealth visit in 4 weeks  Current medicines are reviewed at length with the patient today.   The patient does not have concerns regarding her medicines.  The following changes were made today:  none   Labs/ tests ordered today include:  No orders of the defined types were placed in this encounter.   Patient Risk:  after full review of this patients clinical status, I feel that they are at moderate risk at this time.  Today, I have spent 22 minutes with the patient with telehealth technology discussing stress and PVCs .    SignedHillis Range MD, [facility] 11/11/2018 11:32 AM   [facility] [address] [place] [address] [phone] (office) [phone] (fax)

## 2018-11-15 ENCOUNTER — Ambulatory Visit: Payer: BC Managed Care – PPO | Admitting: Internal Medicine

## 2018-12-13 ENCOUNTER — Telehealth: Payer: BC Managed Care – PPO | Admitting: Internal Medicine

## 2019-03-11 ENCOUNTER — Other Ambulatory Visit: Payer: Self-pay | Admitting: Certified Nurse Midwife

## 2019-03-11 DIAGNOSIS — Z1231 Encounter for screening mammogram for malignant neoplasm of breast: Secondary | ICD-10-CM

## 2019-04-06 ENCOUNTER — Ambulatory Visit: Payer: BC Managed Care – PPO | Admitting: Internal Medicine

## 2019-04-22 ENCOUNTER — Other Ambulatory Visit: Payer: Self-pay

## 2019-04-22 ENCOUNTER — Ambulatory Visit
Admission: RE | Admit: 2019-04-22 | Discharge: 2019-04-22 | Disposition: A | Discharge status: Home / Self Care | Payer: BC Managed Care – PPO | Source: Ambulatory Visit | Attending: Certified Nurse Midwife | Admitting: Certified Nurse Midwife

## 2019-04-22 DIAGNOSIS — Z1231 Encounter for screening mammogram for malignant neoplasm of breast: Secondary | ICD-10-CM

## 2019-05-26 ENCOUNTER — Ambulatory Visit: Payer: Self-pay | Admitting: Surgery

## 2019-05-26 NOTE — H&P (Signed)
Rhonda Duncan Documented: 05/26/2019 3:59 PM Location: [facility] Patient #: [id number] DOB: [birth year] Undefined / Language: [place] / Race: White Female  History of Present Illness Rhonda Sportsman MD; 05/26/2019 4:51 PM) The patient is a 43 year old female who presents with hemorrhoids. Note for "Hemorrhoids": ` ` ` Patient sent for surgical consultation at the request of Dr. Marca Ancona with [facility] gastroenterology  Chief Complaint: Worsening hemorrhoids ` ` The patient is a pleasant active woman. Rhonda Duncan. She's had issues with hemorrhoids since she has been pregnant. Her children are now 11 and 27 years old. Struggles with some constipation. Intermittently uses Metamucil which seems to help. Heart stay on it every day. 5 years she's noted worsening burning and itching with bowel movements. She feels like something is out all the time. This year she noticed blood with wiping and felt more symptomatic. Saw gastroenterology. Had a colonoscopy that noted rather large internal hemorrhoids with external hemorrhoids as well. A sessile ulcerated polyp was removed in the transverse colon. Otherwise underwhelming. Surgical consultation offered.  No personal nor family history of GI/colon cancer, inflammatory bowel disease, irritable bowel syndrome, allergy such as Celiac Sprue, dietary/dairy problems, colitis, ulcers nor gastritis. No recent sick contacts/gastroenteritis. No travel outside the country. No changes in diet. No dysphagia to solids or liquids. No significant heartburn or reflux. No melena, hematemesis, coffee ground emesis. No evidence of prior gastric/peptic ulceration.  (Review of systems as stated in this history (HPI) or in the review of systems. Otherwise all other 12 point ROS are negative) ` ` `   Past Surgical History ([name], RMA; 05/26/2019 3:59 PM) Breast Biopsy Right. Colon Polyp Removal - Colonoscopy Oral  Surgery  Diagnostic Studies History ([name], RMA; 05/26/2019 3:59 PM) Colonoscopy within last year Mammogram within last year Pap Smear 1-5 years ago  Allergies ([name], RMA; 05/26/2019 4:01 PM) No Known Drug Allergies [05/26/2019]: Allergies Reconciled  Medication History ([name], RMA; 05/26/2019 4:01 PM) Nadolol (20MG  Tablet, Oral) Active. Pantoprazole Sodium (40MG  Tablet DR, Oral) Active. Medications Reconciled  Social History (Tanisha A. , RMA; 05/26/2019 3:59 PM) Alcohol use Occasional alcohol use. Caffeine use Carbonated beverages, Coffee. No drug use Tobacco use Former smoker.  Family History ([name], RMA; 05/26/2019 3:59 PM) Arthritis Mother. Colon Polyps Father, Mother. Heart Disease Father. Heart disease in female family member before age 29 Hypertension Father, Mother. Thyroid problems Mother.  Pregnancy / Birth History (Tanisha A. 07/26/2019, RMA; 05/26/2019 3:59 PM) Age at menarche 11 years. Contraceptive History Contraceptive implant. Gravida 3 Length (months) of breastfeeding 7-12 Maternal age 75-30 Para 2 Regular periods  Other Problems (Tanisha A. 07/26/2019, RMA; 05/26/2019 3:59 PM) Gastroesophageal Reflux Disease Hemorrhoids Other disease, cancer, significant illness     Review of Systems ([name] RMA; 05/26/2019 3:59 PM) General Not Present- Appetite Loss, Chills, Fatigue, Fever, Night Sweats, Weight Gain and Weight Loss. Skin Not Present- Change in Wart/Mole, Dryness, Hives, Jaundice, New Lesions, Non-Healing Wounds, Rash and Ulcer. HEENT Not Present- Earache, Hearing Loss, Hoarseness, Nose Bleed, Oral Ulcers, Ringing in the Ears, Seasonal Allergies, Sinus Pain, Sore Throat, Visual Disturbances, Wears glasses/contact lenses and Yellow Eyes. Respiratory Not Present- Bloody sputum, Chronic Cough, Difficulty Breathing, Snoring and Wheezing. Breast Not Present- Breast Mass, Breast Pain,  Nipple Discharge and Skin Changes. Cardiovascular Not Present- Chest Pain, Difficulty Breathing Lying Down, Leg Cramps, Palpitations, Rapid Heart Rate, Shortness of Breath and Swelling of Extremities. Gastrointestinal Present- Hemorrhoids. Not Present- Abdominal  Pain, Bloating, Bloody Stool, Change in Bowel Habits, Chronic diarrhea, Constipation, Difficulty Swallowing, Excessive gas, Gets full quickly at meals, Indigestion, Nausea, Rectal Pain and Vomiting. Female Genitourinary Not Present- Frequency, Nocturia, Painful Urination, Pelvic Pain and Urgency. Musculoskeletal Not Present- Back Pain, Joint Pain, Joint Stiffness, Muscle Pain, Muscle Weakness and Swelling of Extremities. Neurological Not Present- Decreased Memory, Fainting, Headaches, Numbness, Seizures, Tingling, Tremor, Trouble walking and Weakness. Psychiatric Not Present- Anxiety, Bipolar, Change in Sleep Pattern, Depression, Fearful and Frequent crying. Endocrine Not Present- Cold Intolerance, Excessive Hunger, Hair Changes, Heat Intolerance, Hot flashes and New Diabetes. Hematology Not Present- Blood Thinners, Easy Bruising, Excessive bleeding, Gland problems, HIV and Persistent Infections.  Vitals ([name] RMA; 05/26/2019 4:01 PM) 05/26/2019 4:00 PM Weight: 241.8 lb Height: 67in Body Surface Area: 2.19 m Body Mass Index: 37.87 kg/m  Temp.: 98.1F  Pulse: 95 (Regular)  BP: 126/82 (Sitting, Left Arm, Standard)        Physical Exam Rhonda Hector MD; 05/26/2019 4:51 PM)  General Mental Status-Alert. General Appearance-Not in acute distress, Not Sickly. Orientation-Oriented X3. Hydration-Well hydrated. Voice-Normal.  Integumentary Global Assessment Upon inspection and palpation of skin surfaces of the - Axillae: non-tender, no inflammation or ulceration, no drainage. and Distribution of scalp and body hair is normal. General Characteristics Temperature - normal warmth is noted.  Head  and Neck Head-normocephalic, atraumatic with no lesions or palpable masses. Face Global Assessment - atraumatic, no absence of expression. Neck Global Assessment - no abnormal movements, no bruit auscultated on the right, no bruit auscultated on the left, no decreased range of motion, non-tender. Trachea-midline. Thyroid Gland Characteristics - non-tender.  Eye Eyeball - Left-Extraocular movements intact, No Nystagmus - Left. Eyeball - Right-Extraocular movements intact, No Nystagmus - Right. Cornea - Left-No Hazy - Left. Cornea - Right-No Hazy - Right. Sclera/Conjunctiva - Left-No scleral icterus, No Discharge - Left. Sclera/Conjunctiva - Right-No scleral icterus, No Discharge - Right. Pupil - Left-Direct reaction to light normal. Pupil - Right-Direct reaction to light normal.  ENMT Ears Pinna - Left - no drainage observed, no generalized tenderness observed. Pinna - Right - no drainage observed, no generalized tenderness observed. Nose and Sinuses External Inspection of the Nose - no destructive lesion observed. Inspection of the nares - Left - quiet respiration. Inspection of the nares - Right - quiet respiration. Mouth and Throat Lips - Upper Lip - no fissures observed, no pallor noted. Lower Lip - no fissures observed, no pallor noted. Nasopharynx - no discharge present. Oral Cavity/Oropharynx - Tongue - no dryness observed. Oral Mucosa - no cyanosis observed. Hypopharynx - no evidence of airway distress observed.  Chest and Lung Exam Inspection Movements - Normal and Symmetrical. Accessory muscles - No use of accessory muscles in breathing. Palpation Palpation of the chest reveals - Non-tender. Auscultation Breath sounds - Normal and Clear.  Cardiovascular Auscultation Rhythm - Regular. Murmurs & Other Heart Sounds - Auscultation of the heart reveals - No Murmurs and No Systolic Clicks.  Abdomen Inspection Inspection of the abdomen reveals - No  Visible peristalsis and No Abnormal pulsations. Umbilicus - No Bleeding, No Urine drainage. Palpation/Percussion Palpation and Percussion of the abdomen reveal - Soft, Non Tender, No Rebound tenderness, No Rigidity (guarding) and No Cutaneous hyperesthesia. Note: Abdomen soft. Not severely distended. No distasis recti. No umbilical or other anterior abdominal wall hernias  Female Genitourinary Sexual Maturity Tanner 5 - Adult hair pattern. Note: No vaginal bleeding nor discharge  Rectal Note: Please refer to the anoscopy section.  Large right anterior and posterior grade 4 hemorrhoids with external component. Left lateral least grade 2 if not grade 3. No other major abnormalities.  Peripheral Vascular Upper Extremity Inspection - Left - No Cyanotic nailbeds - Left, Not Ischemic. Inspection - Right - No Cyanotic nailbeds - Right, Not Ischemic.  Neurologic Neurologic evaluation reveals -normal attention span and ability to concentrate, able to name objects and repeat phrases. Appropriate fund of knowledge , normal sensation and normal coordination. Mental Status Affect - not angry, not paranoid. Cranial Nerves-Normal Bilaterally. Gait-Normal.  Neuropsychiatric Mental status exam performed with findings of-able to articulate well with normal speech/language, rate, volume and coherence, thought content normal with ability to perform basic computations and apply abstract reasoning and no evidence of hallucinations, delusions, obsessions or homicidal/suicidal ideation.  Musculoskeletal Global Assessment Spine, Ribs and Pelvis - no instability, subluxation or laxity. Right Upper Extremity - no instability, subluxation or laxity.  Lymphatic Head & Neck  General Head & Neck Lymphatics: Bilateral - Description - No Localized lymphadenopathy. Axillary  General Axillary Region: Bilateral - Description - No Localized lymphadenopathy. Femoral & Inguinal  Generalized Femoral  & Inguinal Lymphatics: Left - Description - No Localized lymphadenopathy. Right - Description - No Localized lymphadenopathy.   Results Rhonda Duncan(Ralene Gasparyan C. Adeel Guiffre MD; 05/26/2019 4:51 PM) Procedures  Name Value Date Hemorrhoids Procedure Anal exam: External Hemorrhoid Internal exam: Internal Hemorrhoids (bleeding) Internal Hemorroids ( non-bleeding) prolapse Other: Right posterior greater than right anterior grade 4 hemorrhoids with external component as well. No fissure. No fistula or abscess. Tolerates digital and anoscopic exam. Left lateral pile at least grade 2, possibly grade 3...........Marland Kitchen.Perianal skin clean with good hygiene. No pruritis ani. No pilonidal disease. No fissure. No abscess/fistula. Normal sphincter tone. ....Marland Kitchen.Marland Kitchen.No condyloma warts............Marland Kitchen.Tolerates digital and anoscopic rectal exam. No other rectal masses. Hemorrhoidal piles normal......Marland Kitchen.Exam done with assistance of female Medical Assistant in the room.  Performed: 05/26/2019 4:38 PM    Assessment & Plan Rhonda Duncan(Kaliyah Gladman C. Madalene Mickler MD; 05/26/2019 4:37 PM)  PROLAPSED INTERNAL HEMORRHOIDS, GRADE 4 (K64.3) Impression: Chronically irritated hemorrhoids right anterior and posterior now grade 4 with left lateral at least grade 2. 3. Worsening pain and bleeding.  I think her anatomy is too far gone to try an attempt banding or any other interventions. She will benefit from hemorrhoidal ligation/pexing hemorrhoidectomy of the to obvious external piles as well. She is interested in proceeding with surgery.  I again recommend she stay compliant with a fiber supplement every day the rest of her life.  The anatomy & physiology of the anorectal region was discussed. The pathophysiology of hemorrhoids and differential diagnosis was discussed. Natural history progression was discussed. I stressed the importance of a bowel regimen to have daily soft bowel movements to minimize progression of disease. Goal of one BM /  day ideal. Use of wet wipes, warm baths, avoiding straining, etc were emphasized.  Educational handouts further explaining the pathology, treatment options, and bowel regimen were given as well. The patient expressed understanding.   PROLAPSED INTERNAL HEMORRHOIDS, GRADE 3 (K64.2)  Current Plans ANOSCOPY, DIAGNOSTIC ([id number]) Pt Education - [facility] Hemorrhoids (Edona Schreffler): discussed with patient and provided information. Pt Education - Pamphlet Given - The Hemorrhoid Book: discussed with patient and provided information. You are being scheduled for surgery- Our schedulers will call you.  You should hear from our office's scheduling department within 5 working days about the location, date, and time of surgery. We try to make accommodations for patient's preferences in scheduling surgery, but sometimes the OR schedule or  the surgeon's schedule prevents [place] from making those accommodations.  If you have not heard from our office [phone]) in 5 working days, call the office and ask for your surgeon's nurse.  If you have other questions about your diagnosis, plan, or surgery, call the office and ask for your surgeon's nurse.  The anatomy & physiology of the anorectal region was discussed. The pathophysiology of hemorrhoids and differential diagnosis was discussed. Natural history risks without surgery was discussed. I stressed the importance of a bowel regimen to have daily soft bowel movements to minimize progression of disease. Interventions such as sclerotherapy & banding were discussed.  The patient's symptoms are not adequately controlled by medicines and other non-operative treatments. I feel the risks & problems of no surgery outweigh the operative risks; therefore, I recommended surgery to treat the hemorrhoids by ligation, pexy, and possible resection.  Risks such as bleeding, infection, urinary difficulties, need for further treatment, heart attack, death, and other risks were  discussed. I noted a good likelihood this will help address the problem. Goals of post-operative recovery were discussed as well. Possibility that this will not correct all symptoms was explained. Post-operative pain, bleeding, constipation, and other problems after surgery were discussed. We will work to minimize complications. Educational handouts further explaining the pathology, treatment options, and bowel regimen were given as well. Questions were answered. The patient expresses understanding & wishes to proceed with surgery.   ENCOUNTER FOR PREOPERATIVE EXAMINATION FOR GENERAL SURGICAL PROCEDURE (Z01.818)  Current Plans You are being scheduled for surgery- Our schedulers will call you.  You should hear from our office's scheduling department within 5 working days about the location, date, and time of surgery. We try to make accommodations for patient's preferences in scheduling surgery, but sometimes the OR schedule or the surgeon's schedule prevents [place] from making those accommodations.  If you have not heard from our office [phone] in 5 working days, call the office and ask for your surgeon's nurse.  If you have other questions about your diagnosis, plan, or surgery, call the office and ask for your surgeon's nurse.  The anatomy and the physiology was discussed. The pathophysiology and natural history of the disease was discussed. Options were discussed and recommendations were made. Technique, risks, benefits, & alternatives were discussed. Risks such as stroke, heart attack, bleeding, indection, death, and other risks discussed. Questions answered. The patient agrees to proceed. Pt Education - [facility] Rectal Prep for Anorectal outpatient/office surgery: discussed with patient and provided information. Pt Education - [facility] Rectal Surgery HCI (Markham Dumlao): discussed with patient and provided information. Pt Education - [facility] Good Bowel Health (Alishba Naples)  INTERNAL BLEEDING  HEMORRHOIDS (K64.8)  Rhonda Sportsman, MD, FACS, MASCRS Gastrointestinal and Minimally Invasive Surgery    [address], [address] [place], [place] [address] [phone] Main / Paging [phone] Fax

## 2019-06-06 ENCOUNTER — Other Ambulatory Visit: Payer: Self-pay

## 2019-06-06 ENCOUNTER — Encounter: Payer: Self-pay | Admitting: Internal Medicine

## 2019-06-06 ENCOUNTER — Ambulatory Visit: Payer: BC Managed Care – PPO | Admitting: Internal Medicine

## 2019-06-06 VITALS — BP 110/70 | HR 59 | Ht 66.0 in | Wt 243.0 lb

## 2019-06-06 DIAGNOSIS — I493 Ventricular premature depolarization: Secondary | ICD-10-CM | POA: Insufficient documentation

## 2019-06-06 DIAGNOSIS — I472 Ventricular tachycardia: Secondary | ICD-10-CM

## 2019-06-06 DIAGNOSIS — I4729 Other ventricular tachycardia: Secondary | ICD-10-CM

## 2019-06-06 NOTE — Progress Notes (Signed)
   PCP: Cari Caraway, MD   Primary EP: Dr Rayann Heman  Rhonda Duncan is a 43 y.o. female who presents today for routine electrophysiology followup.  Since last being seen in our clinic, the patient reports doing very well.  Today, she denies symptoms of palpitations, chest pain, shortness of breath,  lower extremity edema, dizziness, presyncope, or syncope.  The patient is otherwise without complaint today.   Past Medical History:  Diagnosis Date  . Ventricular tachycardia [facility])    Past Surgical History:  Procedure Laterality Date  . BREAST BIOPSY Right 2019    ROS- all systems are reviewed and negatives except as per HPI above  Current Outpatient Medications  Medication Sig Dispense Refill  . acetaminophen (TYLENOL) 325 MG tablet Take 325-650 mg by mouth every 8 (eight) hours as needed for mild pain or headache.     Marland Kitchen FIBER ADULT GUMMIES PO Take 2 tablets by mouth daily.    Marland Kitchen ibuprofen (ADVIL,MOTRIN) 200 MG tablet Take 200-400 mg by mouth every 8 (eight) hours as needed (for pain or discomfort).     . nadolol (CORGARD) 20 MG tablet Take 1 tablet (20 mg total) by mouth daily. 90 tablet 3  . pantoprazole (PROTONIX) 40 MG tablet Take 40 mg by mouth daily.     No current facility-administered medications for this visit.     Physical Exam: Vitals:   06/06/19 0850  BP: 110/70  Pulse: (!) 59  SpO2: 99%  Weight: 243 lb (110.2 kg)  Height: 5\' 6"  (1.676 m)    GEN- The patient is well appearing, alert and oriented x 3 today.   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

## 2019-06-06 NOTE — Patient Instructions (Addendum)

## 2019-07-27 ENCOUNTER — Other Ambulatory Visit: Payer: Self-pay | Admitting: Internal Medicine

## 2019-07-27 MED ORDER — NADOLOL 20 MG PO TABS: 20.0000 mg | ORAL_TABLET | Freq: Every day | ORAL | 3 refills | Status: DC

## 2019-12-19 ENCOUNTER — Ambulatory Visit: Payer: BC Managed Care – PPO | Admitting: Internal Medicine

## 2020-02-15 ENCOUNTER — Ambulatory Visit: Payer: BC Managed Care – PPO | Admitting: Internal Medicine

## 2020-02-15 ENCOUNTER — Other Ambulatory Visit: Payer: Self-pay | Admitting: Obstetrics

## 2020-02-15 ENCOUNTER — Other Ambulatory Visit: Payer: Self-pay

## 2020-02-15 ENCOUNTER — Other Ambulatory Visit: Payer: Self-pay | Admitting: Certified Nurse Midwife

## 2020-02-15 ENCOUNTER — Encounter: Payer: Self-pay | Admitting: Internal Medicine

## 2020-02-15 VITALS — BP 102/70 | HR 59 | Ht 66.0 in | Wt 246.0 lb

## 2020-02-15 DIAGNOSIS — I472 Ventricular tachycardia: Secondary | ICD-10-CM

## 2020-02-15 DIAGNOSIS — Z1231 Encounter for screening mammogram for malignant neoplasm of breast: Secondary | ICD-10-CM

## 2020-02-15 DIAGNOSIS — I4729 Other ventricular tachycardia: Secondary | ICD-10-CM

## 2020-02-15 MED ORDER — NADOLOL 20 MG PO TABS
10.0000 mg | ORAL_TABLET | Freq: Every day | ORAL | 3 refills | Status: DC
Start: 2020-02-15 — End: 2020-08-09

## 2020-02-15 NOTE — Progress Notes (Signed)
   PCP: Gweneth Dimitri, MD   Primary EP: Dr Johney Frame  Rhonda Duncan is a 44 y.o. female who presents today for routine electrophysiology followup.  Since last being seen in our clinic, the patient reports doing very well.  Today, she denies symptoms of palpitations, chest pain, shortness of breath,  lower extremity edema, dizziness, presyncope, or syncope.  The patient is otherwise without complaint today.   Past Medical History:  Diagnosis Date  . Ventricular tachycardia [facility])    Past Surgical History:  Procedure Laterality Date  . BREAST BIOPSY Right 2019    ROS- all systems are reviewed and negatives except as per HPI above  Current Outpatient Medications  Medication Sig Dispense Refill  . acetaminophen (TYLENOL) 325 MG tablet Take 325-650 mg by mouth every 8 (eight) hours as needed for mild pain or headache.     . ibuprofen (ADVIL,MOTRIN) 200 MG tablet Take 200-400 mg by mouth every 8 (eight) hours as needed (for pain or discomfort).     . nadolol (CORGARD) 20 MG tablet Take 1 tablet (20 mg total) by mouth daily. 90 tablet 3  . psyllium (METAMUCIL) 58.6 % packet Take 1 packet by mouth daily.     No current facility-administered medications for this visit.    Physical Exam: Vitals:   02/15/20 1614  BP: 102/70  Pulse: (!) 59  SpO2: 97%  Weight: 246 lb (111.6 kg)  Height: 5\' 6"  (1.676 m)    GEN- The patient is well appearing, alert and oriented x 3 today.   Head- normocephalic, atraumatic Eyes-  Sclera clear, conjunctiva pink Ears- hearing intact Oropharynx- clear Lungs-  normal work of breathing Heart- Regular rate and rhythm  GI- soft  Extremities- no clubbing, cyanosis, or edema  Wt Readings from Last 3 Encounters:  02/15/20 246 lb (111.6 kg)  06/06/19 243 lb (110.2 kg)  09/08/18 233 lb 6.4 oz (105.9 kg)    EKG tracing ordered today is personally reviewed and shows sinus, no PVCs  Assessment and Plan:  1. PVCs/ NSVT Well controlled  Reduce  nadolol to 10mg  daily Repeat echo to exclude changes related to tachycardia  Return in 6 months  Risks, benefits and potential toxicities for medications prescribed and/or refilled reviewed with patient today.   09/10/18 MD, [facility] 02/15/2020 4:23 PM

## 2020-02-15 NOTE — Patient Instructions (Signed)
Medication Instructions:  Your physician has recommended you make the following change in your medication:  1.  Reduce your nadolol 20 mg-  Take 1/2 tablet (10 mg) by mouth daily   Labwork: None ordered.  Testing/Procedures: Your physician has requested that you have an echocardiogram. Echocardiography is a painless test that uses sound waves to create images of your heart. It provides your doctor with information about the size and shape of your heart and how well your heart's chambers and valves are working. This procedure takes approximately one hour. There are no restrictions for this procedure.  Please schedule for ECHO  Follow-Up: Your physician wants you to follow-up in: 6 months with Dr. Johney Frame.     Any Other Special Instructions Will Be Listed Below (If Applicable).  If you need a refill on your cardiac medications before your next appointment, please call your pharmacy.

## 2020-03-12 ENCOUNTER — Ambulatory Visit (HOSPITAL_COMMUNITY): Payer: BC Managed Care – PPO | Attending: Cardiovascular Disease

## 2020-03-12 ENCOUNTER — Other Ambulatory Visit: Payer: Self-pay

## 2020-03-12 DIAGNOSIS — I472 Ventricular tachycardia: Secondary | ICD-10-CM | POA: Diagnosis not present

## 2020-03-12 DIAGNOSIS — I4729 Other ventricular tachycardia: Secondary | ICD-10-CM

## 2020-03-12 LAB — ECHOCARDIOGRAM COMPLETE: S' Lateral: 3.4 cm

## 2020-04-25 ENCOUNTER — Ambulatory Visit
Admission: RE | Admit: 2020-04-25 | Discharge: 2020-04-25 | Disposition: A | Discharge status: Home / Self Care | Payer: BC Managed Care – PPO | Source: Ambulatory Visit | Attending: Obstetrics | Admitting: Obstetrics

## 2020-04-25 ENCOUNTER — Other Ambulatory Visit: Payer: Self-pay

## 2020-04-25 DIAGNOSIS — Z1231 Encounter for screening mammogram for malignant neoplasm of breast: Secondary | ICD-10-CM

## 2020-08-09 ENCOUNTER — Other Ambulatory Visit: Payer: Self-pay | Admitting: Internal Medicine

## 2020-08-20 ENCOUNTER — Ambulatory Visit: Payer: BC Managed Care – PPO | Admitting: Internal Medicine

## 2020-09-19 ENCOUNTER — Ambulatory Visit: Payer: BC Managed Care – PPO | Admitting: Internal Medicine

## 2020-09-26 IMAGING — MG DIGITAL SCREENING BILAT W/ TOMO W/ CAD
8 series · 8 of 24 positions shown · non-contrast
Comparison: Previous exam(s).

CLINICAL DATA: Screening.

EXAM:
DIGITAL SCREENING BILATERAL MAMMOGRAM WITH TOMO AND CAD

[R CC synth-2D]
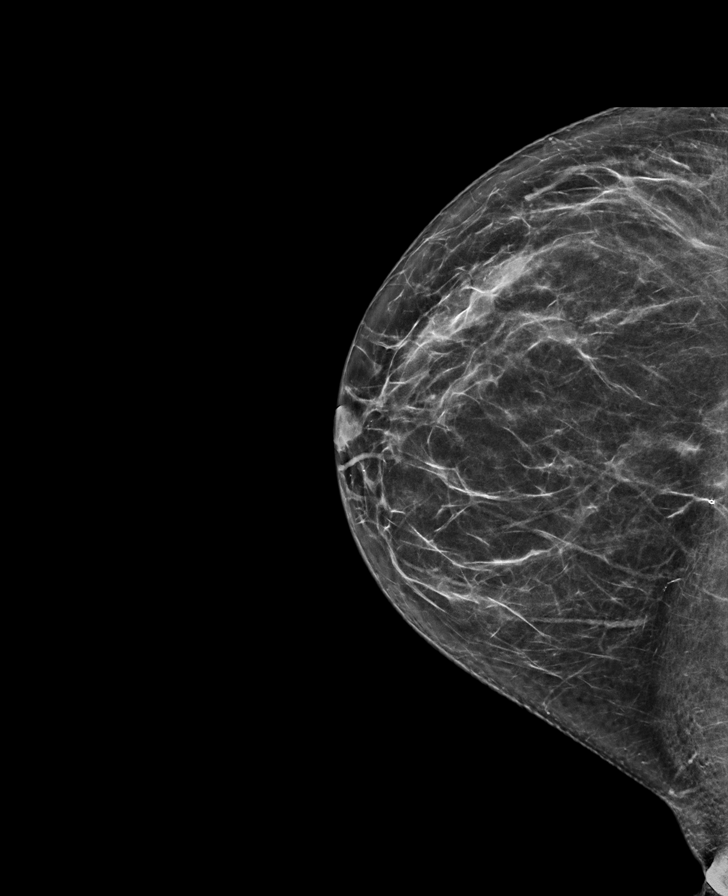

[L CC synth-2D]
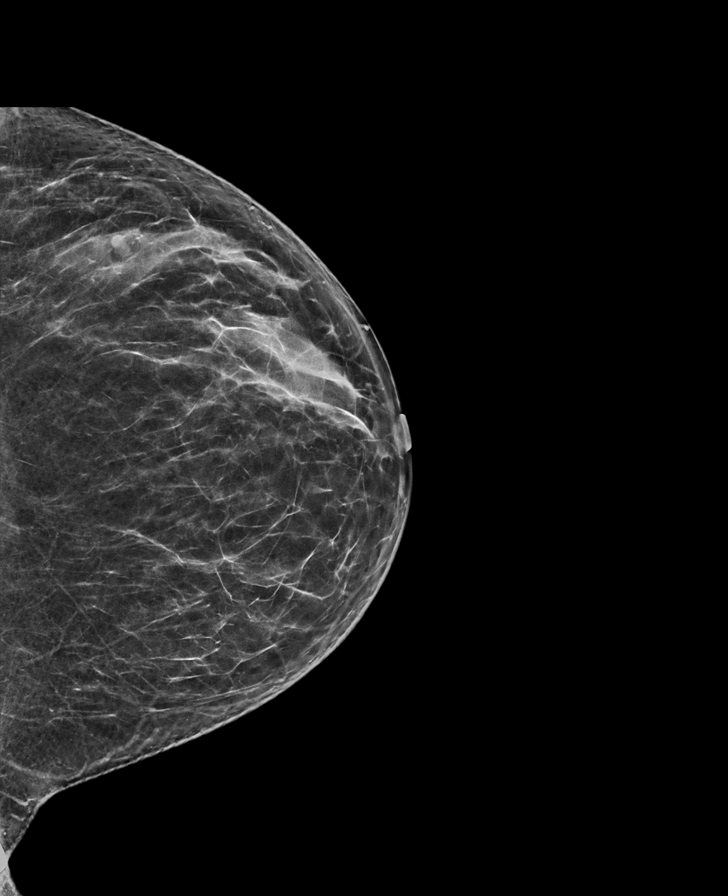

[L MLO synth-2D]
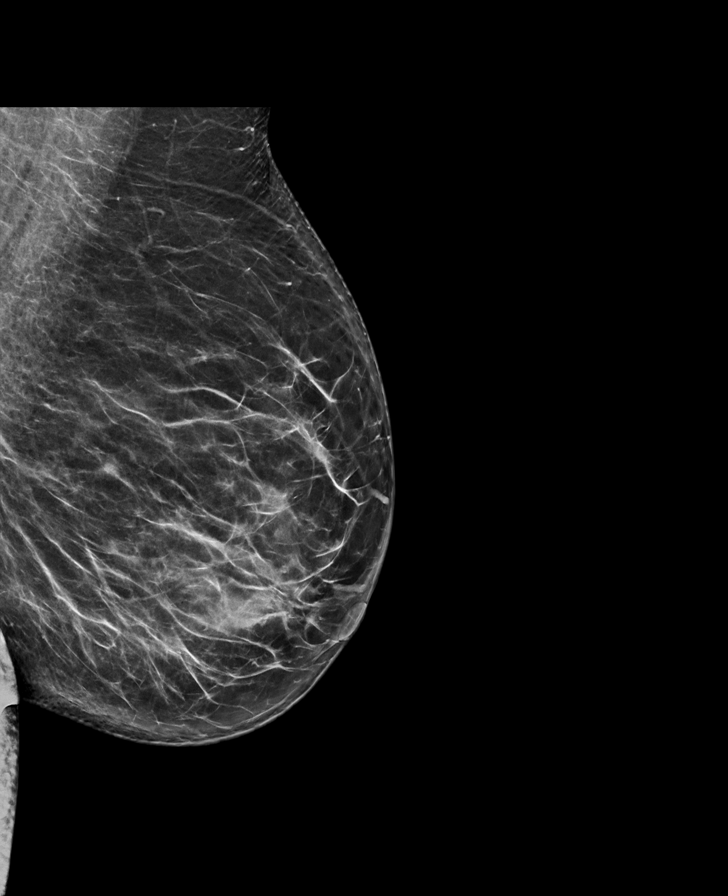

[R MLO synth-2D]
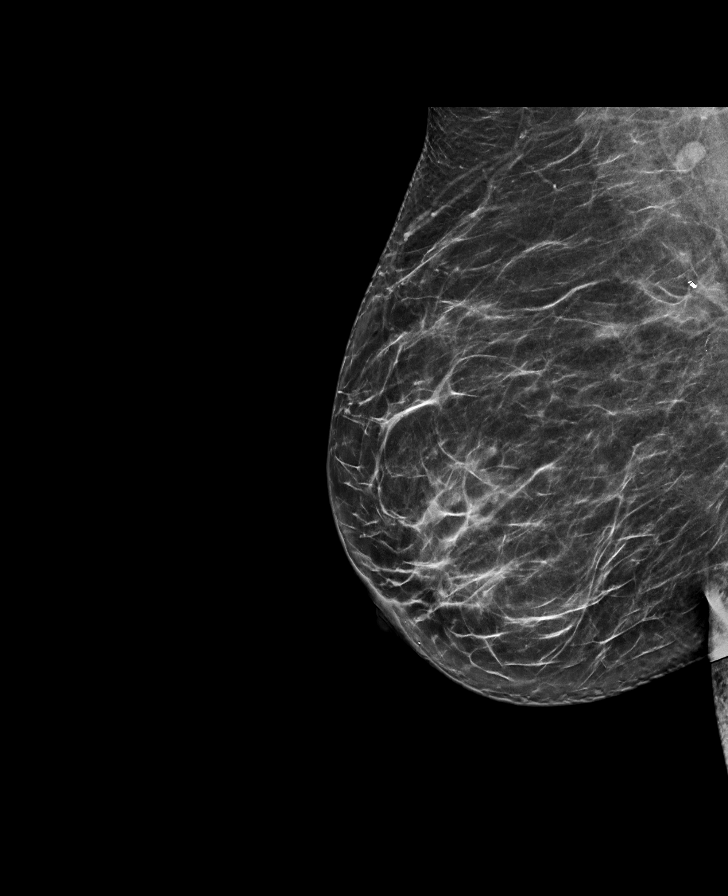

[R CC tomo · tomo slice 35/69.0]
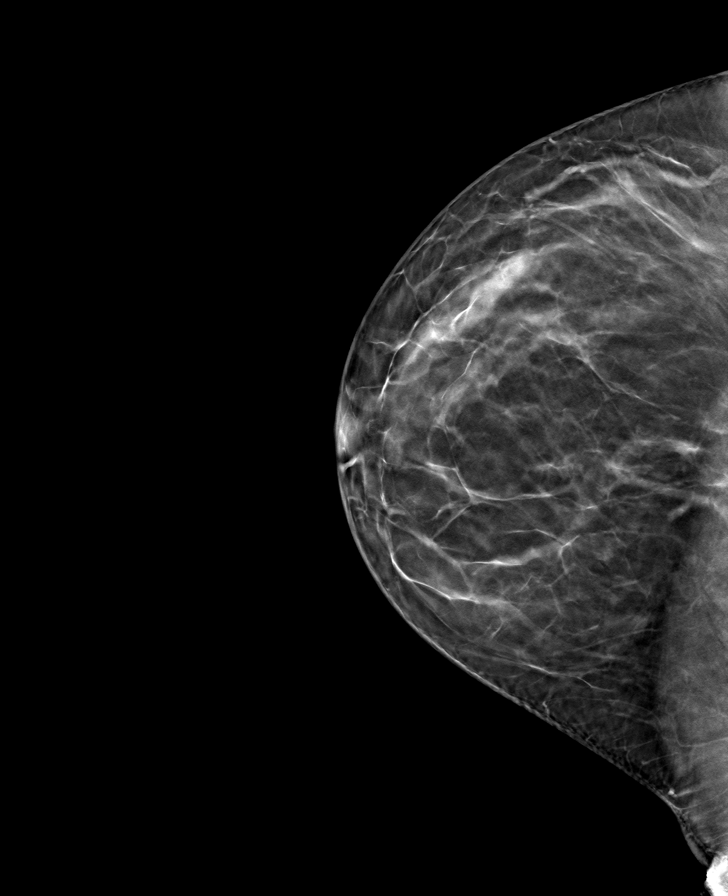

[R MLO tomo · tomo slice 39/78.0]
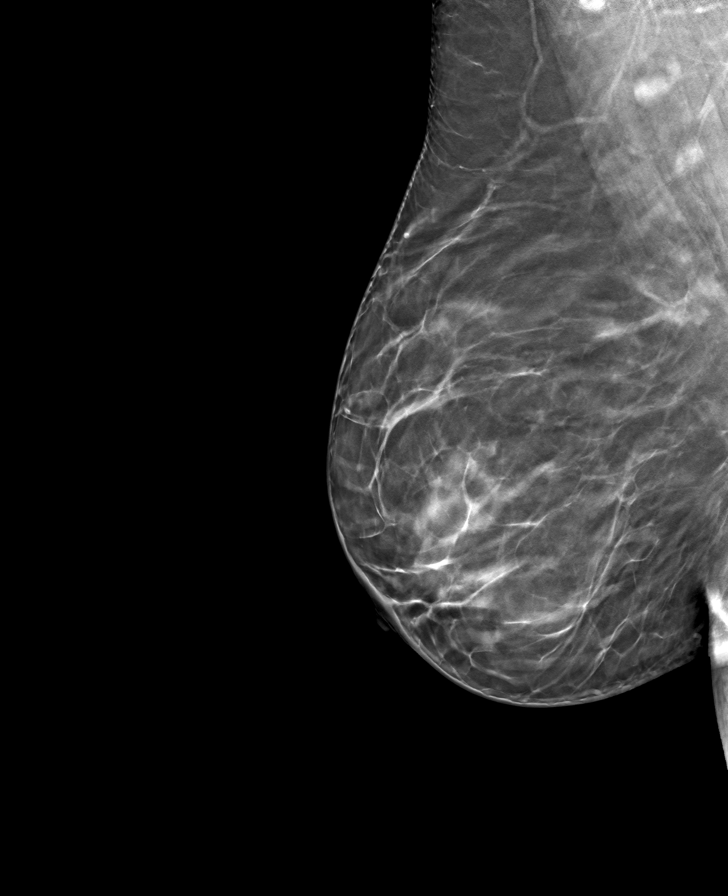

[L MLO tomo · tomo slice 40/79.0]
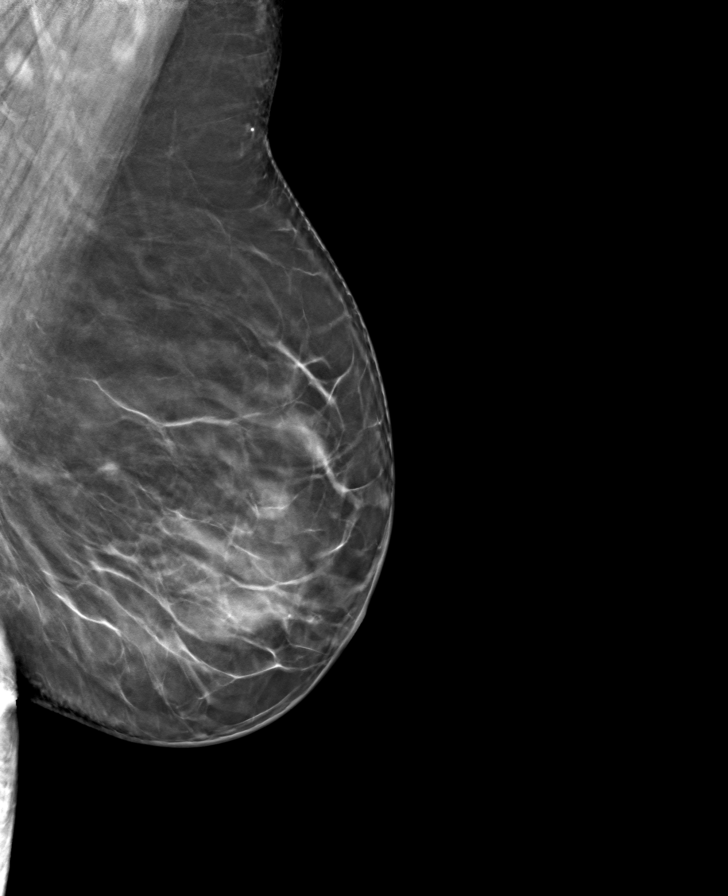

[L CC tomo · tomo slice 35/69.0]
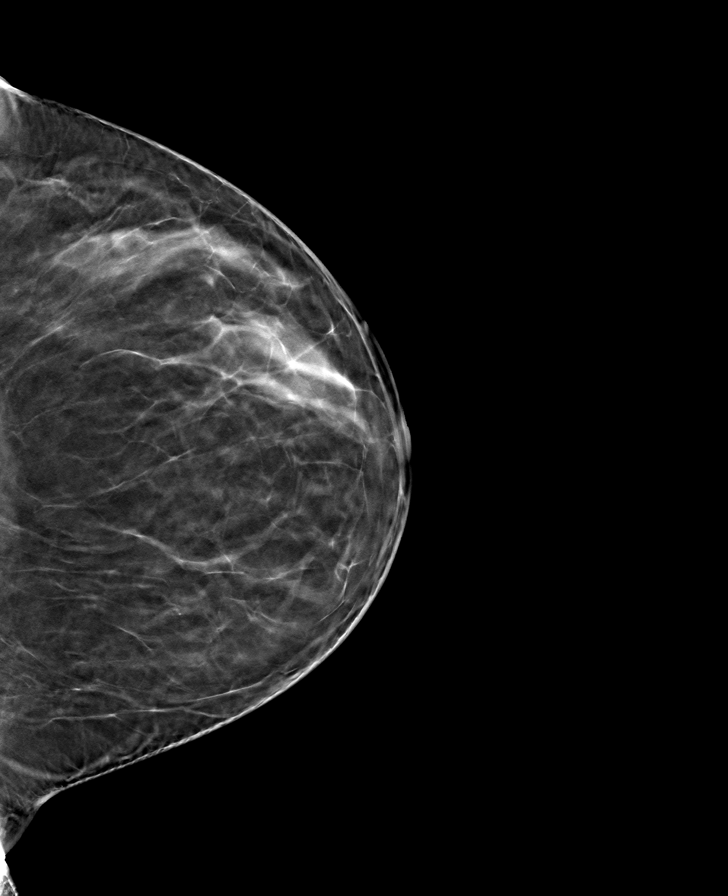

[8 of 24 positions shown; findings below may reference images not displayed]

ACR Breast Density Category b: There are scattered areas of
fibroglandular density.
FINDINGS: There are no findings suspicious for malignancy. Images were
processed with CAD.
IMPRESSION: No mammographic evidence of malignancy. A result letter of this
screening mammogram will be mailed directly to the patient.

RECOMMENDATION:
Screening mammogram in one year. (Code:[id number])

BI-RADS CATEGORY  1: Negative.

## 2020-10-15 ENCOUNTER — Ambulatory Visit: Payer: BC Managed Care – PPO | Admitting: Internal Medicine

## 2020-10-15 ENCOUNTER — Other Ambulatory Visit: Payer: Self-pay

## 2020-10-15 ENCOUNTER — Encounter: Payer: Self-pay | Admitting: Internal Medicine

## 2020-10-15 VITALS — BP 108/64 | HR 69 | Ht 66.0 in | Wt 241.4 lb

## 2020-10-15 DIAGNOSIS — I493 Ventricular premature depolarization: Secondary | ICD-10-CM | POA: Diagnosis not present

## 2020-10-15 DIAGNOSIS — I472 Ventricular tachycardia: Secondary | ICD-10-CM

## 2020-10-15 DIAGNOSIS — I4729 Other ventricular tachycardia: Secondary | ICD-10-CM

## 2020-10-15 NOTE — Patient Instructions (Addendum)
Medication Instructions:  Your physician recommends that you continue on your current medications as directed. Please refer to the Current Medication list given to you today.  Labwork: None ordered.  Testing/Procedures: None ordered.  Follow-Up: Your physician wants you to follow-up in: 10/14/21 at 9:30 am with Dr. Johney Frame.    Any Other Special Instructions Will Be Listed Below (If Applicable).  If you need a refill on your cardiac medications before your next appointment, please call your pharmacy.

## 2020-10-15 NOTE — Progress Notes (Signed)
   PCP: Gweneth Dimitri, MD   Primary EP: Dr Johney Frame  Rhonda Duncan is a 45 y.o. female who presents today for routine electrophysiology followup.  Since last being seen in our clinic, the patient reports doing very well.  Today, she denies symptoms of palpitations, chest pain, shortness of breath,  lower extremity edema, dizziness, presyncope, or syncope.  The patient is otherwise without complaint today.   Past Medical History:  Diagnosis Date  . Ventricular tachycardia [facility])    Past Surgical History:  Procedure Laterality Date  . BREAST BIOPSY Right 2019    ROS- all systems are reviewed and negatives except as per HPI above  Current Outpatient Medications  Medication Sig Dispense Refill  . acetaminophen (TYLENOL) 325 MG tablet Take 325-650 mg by mouth every 8 (eight) hours as needed for mild pain or headache.     . ibuprofen (ADVIL,MOTRIN) 200 MG tablet Take 200-400 mg by mouth every 8 (eight) hours as needed (for pain or discomfort).     . nadolol (CORGARD) 20 MG tablet Take 20 mg by mouth daily.    . psyllium (METAMUCIL) 58.6 % packet Take 1 packet by mouth daily.     No current facility-administered medications for this visit.    Physical Exam: Vitals:   10/15/20 0853  BP: 108/64  Pulse: 69  SpO2: 96%  Weight: 241 lb 6.4 oz (109.5 kg)  Height: 5\' 6"  (1.676 m)    GEN- The patient is well appearing, alert and oriented x 3 today.   Head- normocephalic, atraumatic Eyes-  Sclera clear, conjunctiva pink Ears- hearing intact Oropharynx- clear Lungs- Clear to ausculation bilaterally, normal work of breathing Heart- Regular rate and rhythm, no murmurs, rubs or gallops, PMI not laterally displaced GI- soft, NT, ND, + BS Extremities- no clubbing, cyanosis, or edema  Wt Readings from Last 3 Encounters:  10/15/20 241 lb 6.4 oz (109.5 kg)  02/15/20 246 lb (111.6 kg)  06/06/19 243 lb (110.2 kg)    EKG tracing ordered today is personally reviewed and shows  sinus  Assessment and Plan:  1. NSVT/ PVCs Well controlled with low dose nadolol  Echo 03/12/20- normal  Risks, benefits and potential toxicities for medications prescribed and/or refilled reviewed with patient today.   Return in a year  03/14/20 MD, [facility] 10/15/2020 9:05 AM

## 2021-01-02 MED ORDER — NADOLOL 20 MG PO TABS: 20.0000 mg | ORAL_TABLET | Freq: Every day | ORAL | 3 refills | Status: DC

## 2021-02-04 ENCOUNTER — Emergency Department (HOSPITAL_BASED_OUTPATIENT_CLINIC_OR_DEPARTMENT_OTHER)
Admission: EM | Admit: 2021-02-04 | Discharge: 2021-02-04 | Disposition: A | Discharge status: Home / Self Care | Payer: BC Managed Care – PPO | Attending: Emergency Medicine | Admitting: Emergency Medicine

## 2021-02-04 ENCOUNTER — Emergency Department (HOSPITAL_BASED_OUTPATIENT_CLINIC_OR_DEPARTMENT_OTHER): Payer: BC Managed Care – PPO

## 2021-02-04 ENCOUNTER — Other Ambulatory Visit: Payer: Self-pay

## 2021-02-04 ENCOUNTER — Encounter (HOSPITAL_BASED_OUTPATIENT_CLINIC_OR_DEPARTMENT_OTHER): Payer: Self-pay | Admitting: *Deleted

## 2021-02-04 DIAGNOSIS — W52XXXA Crushed, pushed or stepped on by crowd or human stampede, initial encounter: Secondary | ICD-10-CM | POA: Diagnosis not present

## 2021-02-04 DIAGNOSIS — S9781XA Crushing injury of right foot, initial encounter: Secondary | ICD-10-CM

## 2021-02-04 DIAGNOSIS — S99921A Unspecified injury of right foot, initial encounter: Secondary | ICD-10-CM | POA: Diagnosis present

## 2021-02-04 DIAGNOSIS — S9031XA Contusion of right foot, initial encounter: Secondary | ICD-10-CM | POA: Diagnosis not present

## 2021-02-04 DIAGNOSIS — T1490XA Injury, unspecified, initial encounter: Secondary | ICD-10-CM

## 2021-02-04 NOTE — ED Provider Notes (Signed)
[facility] EMERGENCY DEPT Provider Note   CSN: [id number] Arrival date & time: 02/04/21  1542     History Chief Complaint  Patient presents with   Foot Pain    Jessamine Barcia Quintanilla is a 45 y.o. female.  HPI Patient was at a lacrosse match 8 days ago.  One of the players was chasing a ball and ended up stepping on her right foot with high impact wearing cleats.  The foot became quite swollen.  She has been elevating it and icing.  She reports it was improving but then more recently she started to get some discomfort again around the forefoot and also started to see purpleish discolorations developing around the toes and the sole of the foot.  She wanted to make sure there was not actually a fracture present.  Reports she is walking on it.  She does get pain relief with ibuprofen.    Past Medical History:  Diagnosis Date   Ventricular tachycardia [facility])     Patient Active Problem List   Diagnosis Date Noted   PVC's (premature ventricular contractions) 06/06/2019   NSVT (nonsustained ventricular tachycardia) (HCC) 07/02/2018    Past Surgical History:  Procedure Laterality Date   BREAST BIOPSY Right 2019     OB History   No obstetric history on file.     Family History  Problem Relation Age of Onset   Heart disease Father        CAGB in his 57s   Heart attack Paternal Grandfather        died from MI in 94s-60s   Breast cancer Neg Hx     Social History   Tobacco Use   Smoking status: Never   Smokeless tobacco: Never  Vaping Use   Vaping Use: Never used  Substance Use Topics   Alcohol use: Yes   Drug use: Never    Home Medications Prior to Admission medications   Medication Sig Start Date End Date Taking? Authorizing Provider  acetaminophen (TYLENOL) 325 MG tablet Take 325-650 mg by mouth every 8 (eight) hours as needed for mild pain or headache.     [provider]  ibuprofen (ADVIL,MOTRIN) 200 MG tablet Take 200-400 mg by mouth  every 8 (eight) hours as needed (for pain or discomfort).     [provider]  nadolol (CORGARD) 20 MG tablet Take 1 tablet (20 mg total) by mouth daily. 01/02/21   Allred, Fayrene Fearing, MD  psyllium (METAMUCIL) 58.6 % packet Take 1 packet by mouth daily.    [provider]    Allergies    Patient has no known allergies.  Review of Systems   Review of Systems Constitutional: No fever no chills no malaise Respiratory: No shortness of breath no chest pain Physical Exam Updated Vital Signs Ht 5' 6.5" (1.689 m)   Wt 104.3 kg   LMP 02/01/2021   BMI 36.57 kg/m   Physical Exam Constitutional:      Comments: Alert and well in appearance  Pulmonary:     Effort: Pulmonary effort is normal.  Musculoskeletal:     Comments: Right foot has moderate diffuse swelling of the forefoot.  There is resolving hematoma with ecchymotic discolorations around the bases of the toes and along the plantar surface of the sole.  Foot is warm and dry.  She has good movement of the ankle and the toes.  Dorsalis pedis pulse 2+ and symmetric.  Calf is soft and nontender peer  Skin:    General:  Skin is warm and dry.  Neurological:     General: No focal deficit present.  Psychiatric:        Mood and Affect: Mood normal.    ED Results / Procedures / Treatments   Labs (all labs ordered are listed, but only abnormal results are displayed) Labs Reviewed - No data to display  EKG None  Radiology DG Foot Complete Right  Result Date: 02/04/2021 CLINICAL DATA:  Pain after stepped on by person wearing cleats EXAM: RIGHT FOOT COMPLETE - 3+ VIEW COMPARISON:  None. FINDINGS: Frontal, oblique, and lateral views were obtained. There is no fracture or dislocation. Joint spaces appear normal. No radiopaque foreign body. No soft tissue air. IMPRESSION: No fracture or dislocation. No radiopaque foreign body. Joint spaces unremarkable. Electronically Signed   By: Bretta Bang III M.D.   On: 02/04/2021 16:25     Procedures Procedures   Medications Ordered in ED Medications - No data to display  ED Course  I have reviewed the triage vital signs and the nursing notes.  Pertinent labs & imaging results that were available during my care of the patient were reviewed by me and considered in my medical decision making (see chart for details).    MDM Rules/Calculators/A&P                          Patient presents with crush injury to the right foot.  It has been 8 days.  It is in the resolution phase.  He does continue to have some swelling.  No signs at this time of secondary infection.  Good pulses.  No signs of compartment syndrome.  We placed in a cam walker for additional comfort and support in the as she heals. recommendation is for continued elevating icing and follow-up with orthopedics Final Clinical Impression(s) / ED Diagnoses Final diagnoses:  Injury  Crushing injury of right foot, initial encounter    Rx / DC Orders ED Discharge Orders     None        Arby Barrette, MD 02/04/21 [phone]

## 2021-02-04 NOTE — ED Triage Notes (Signed)
Injury to rt foot on June 5th, Stepped on by cleat as person ran out of bounds and come down on top of rt foot, bruising and swelling noted. Able to move toes, has numbness after standing on foot a while.

## 2021-02-04 NOTE — Discharge Instructions (Addendum)
1.  Wear the cam walker when you are up and about.  This will help stabilize your foot and feel more comfortable. 2.  When you are not up and walking continue to elevate your foot is much as possible. 3.  Schedule follow-up appointment with orthopedics for recheck.

## 2021-05-24 ENCOUNTER — Other Ambulatory Visit: Payer: Self-pay | Admitting: Obstetrics

## 2021-05-24 DIAGNOSIS — Z1231 Encounter for screening mammogram for malignant neoplasm of breast: Secondary | ICD-10-CM

## 2021-05-29 ENCOUNTER — Ambulatory Visit
Admission: RE | Admit: 2021-05-29 | Discharge: 2021-05-29 | Disposition: A | Discharge status: Home / Self Care | Payer: BC Managed Care – PPO | Source: Ambulatory Visit | Attending: Obstetrics | Admitting: Obstetrics

## 2021-05-29 ENCOUNTER — Other Ambulatory Visit: Payer: Self-pay

## 2021-05-29 DIAGNOSIS — Z1231 Encounter for screening mammogram for malignant neoplasm of breast: Secondary | ICD-10-CM

## 2021-10-14 ENCOUNTER — Ambulatory Visit: Payer: BC Managed Care – PPO | Admitting: Internal Medicine

## 2021-10-31 ENCOUNTER — Ambulatory Visit (HOSPITAL_BASED_OUTPATIENT_CLINIC_OR_DEPARTMENT_OTHER): Payer: BC Managed Care – PPO | Admitting: Internal Medicine

## 2021-10-31 ENCOUNTER — Encounter (HOSPITAL_BASED_OUTPATIENT_CLINIC_OR_DEPARTMENT_OTHER): Payer: Self-pay | Admitting: Internal Medicine

## 2021-10-31 ENCOUNTER — Other Ambulatory Visit: Payer: Self-pay

## 2021-10-31 VITALS — BP 126/76 | HR 62 | Ht 66.5 in | Wt 244.1 lb

## 2021-10-31 DIAGNOSIS — I493 Ventricular premature depolarization: Secondary | ICD-10-CM | POA: Diagnosis not present

## 2021-10-31 DIAGNOSIS — I4729 Other ventricular tachycardia: Secondary | ICD-10-CM | POA: Diagnosis not present

## 2021-10-31 NOTE — Patient Instructions (Addendum)

## 2021-10-31 NOTE — Progress Notes (Signed)
? ?  PCP: Gweneth Dimitri, MD ?  ?Primary EP: Dr Johney Frame ? ?Rhonda Duncan is a 46 y.o. female who presents today for routine electrophysiology followup.  Since last being seen in our clinic, the patient reports doing very well.  Today, she denies symptoms of palpitations, chest pain, shortness of breath,  lower extremity edema, dizziness, presyncope, or syncope.  The patient is otherwise without complaint today.  ? ?Past Medical History:  ?Diagnosis Date  ? Ventricular tachycardia   ? ?Past Surgical History:  ?Procedure Laterality Date  ? BREAST BIOPSY Right 2019  ? ? ?ROS- all systems are reviewed and negatives except as per HPI above ? ?Current Outpatient Medications  ?Medication Sig Dispense Refill  ? acetaminophen (TYLENOL) 325 MG tablet Take 325-650 mg by mouth every 8 (eight) hours as needed for mild pain or headache.     ? ibuprofen (ADVIL,MOTRIN) 200 MG tablet Take 200-400 mg by mouth every 8 (eight) hours as needed (for pain or discomfort).     ? nadolol (CORGARD) 20 MG tablet Take 1 tablet (20 mg total) by mouth daily. 90 tablet 3  ? ?No current facility-administered medications for this visit.  ? ? ?Physical Exam: ?Vitals:  ? 10/31/21 1408  ?BP: 126/76  ?Pulse: 62  ?SpO2: 98%  ?Weight: 244 lb 1.6 oz (110.7 kg)  ?Height: 5' 6.5" (1.689 m)  ? ? ?GEN- The patient is well appearing, alert and oriented x 3 today.   ?Head- normocephalic, atraumatic ?Eyes-  Sclera clear, conjunctiva pink ?Ears- hearing intact ?Oropharynx- clear ?Lungs- Clear to ausculation bilaterally, normal work of breathing ?Heart- Regular rate and rhythm, no murmurs, rubs or gallops, PMI not laterally displaced ?GI- soft, NT, ND, + BS ?Extremities- no clubbing, cyanosis, or edema ? ?Wt Readings from Last 3 Encounters:  ?10/31/21 244 lb 1.6 oz (110.7 kg)  ?02/04/21 230 lb (104.3 kg)  ?10/15/20 241 lb 6.4 oz (109.5 kg)  ? ? ?EKG tracing ordered today is personally reviewed and shows sinus ? ?Assessment and Plan: ? ?PVCs, NSVT ?Well  controlled with low dose nadolol ?No changes ? ?Return in a year ? ?Hillis Range MD, FACC ?10/31/2021 ?2:19 PM ? ? ? ? ?

## 2022-01-03 ENCOUNTER — Other Ambulatory Visit: Payer: Self-pay | Admitting: Internal Medicine

## 2022-05-27 ENCOUNTER — Other Ambulatory Visit: Payer: Self-pay | Admitting: Family Medicine

## 2022-05-27 DIAGNOSIS — Z1231 Encounter for screening mammogram for malignant neoplasm of breast: Secondary | ICD-10-CM

## 2022-06-20 ENCOUNTER — Ambulatory Visit
Admission: RE | Admit: 2022-06-20 | Discharge: 2022-06-20 | Disposition: A | Discharge status: Home / Self Care | Payer: BC Managed Care – PPO | Source: Ambulatory Visit | Attending: Family Medicine | Admitting: Family Medicine

## 2022-06-20 DIAGNOSIS — Z1231 Encounter for screening mammogram for malignant neoplasm of breast: Secondary | ICD-10-CM

## 2023-06-23 ENCOUNTER — Other Ambulatory Visit: Payer: Self-pay | Admitting: Family Medicine

## 2023-06-23 DIAGNOSIS — Z1231 Encounter for screening mammogram for malignant neoplasm of breast: Secondary | ICD-10-CM

## 2023-07-22 ENCOUNTER — Ambulatory Visit
Admission: RE | Admit: 2023-07-22 | Discharge: 2023-07-22 | Disposition: A | Discharge status: Home / Self Care | Payer: BC Managed Care – PPO | Source: Ambulatory Visit | Attending: Family Medicine | Admitting: Family Medicine

## 2023-07-22 DIAGNOSIS — Z1231 Encounter for screening mammogram for malignant neoplasm of breast: Secondary | ICD-10-CM

## 2023-12-28 ENCOUNTER — Ambulatory Visit: Payer: Self-pay | Attending: Cardiology | Admitting: Cardiology

## 2023-12-28 ENCOUNTER — Encounter: Payer: Self-pay | Admitting: Cardiology

## 2023-12-28 ENCOUNTER — Ambulatory Visit: Attending: Cardiology

## 2023-12-28 VITALS — BP 140/86 | HR 100 | Ht 66.0 in | Wt 248.0 lb

## 2023-12-28 DIAGNOSIS — I493 Ventricular premature depolarization: Secondary | ICD-10-CM | POA: Diagnosis not present

## 2023-12-28 DIAGNOSIS — R030 Elevated blood-pressure reading, without diagnosis of hypertension: Secondary | ICD-10-CM

## 2023-12-28 DIAGNOSIS — E785 Hyperlipidemia, unspecified: Secondary | ICD-10-CM

## 2023-12-28 DIAGNOSIS — I4729 Other ventricular tachycardia: Secondary | ICD-10-CM

## 2023-12-28 MED ORDER — NADOLOL 20 MG PO TABS: 20.0000 mg | ORAL_TABLET | Freq: Every day | ORAL | 3 refills | Status: DC

## 2023-12-28 NOTE — Progress Notes (Unsigned)
 Enrolled for [employer] to mail a ZIO XT long term holter monitor to the patients address on file.  Requested mailing date of 02/23/24 for patient to wear after 03/08/24.

## 2023-12-28 NOTE — Progress Notes (Signed)
 Cardiology Office Note:  .   Date:  01/02/2024  ID:  Rhonda Duncan, DOB May 21, 1976, MRN [id number] PCP: Helyn Lobstein, MD  [facility] Providers Cardiologist:  Randene Bustard, MD     Chief Complaint  Patient presents with   Follow-up    Essentially reestablishing care with recurrent palpitations off of medications.   Palpitations    Previously had issues with PVCs, NSVT well-controlled with nadolol .  Weaned off nadolol  and now having recurrent symptoms.    Patient Profile: .     Rhonda Duncan is a  48 y.o. female  with a PMH notable for frequent PVCs with runs of NSVT who presents here for essentially 2-year follow-up at the request of Helyn Lobstein, MD.     Rhonda Duncan was last seen on 10/31/2021 by Dr. Jolly Needle.  At that time she was noticing that doing well with no palpitations or chest pain.  No other symptoms.  Was on nadolol  20 mg daily plan was for 1 year follow-up.  Subjective  Discussed the use of AI scribe software for clinical note transcription with the patient, who gave verbal consent to proceed.  History of Present Illness History of Present Illness Rhonda Duncan "Rhonda Duncan" is a 48 year old female with premature beats who presents with dizziness and variable heart rate.  She has a history of premature beats and was previously managed on nadolol , which she discontinued over a year ago after being weaned off due to improvement. Her heart rate now varies significantly, ranging from 40 to 120 beats per minute throughout the day, with appropriate increases during exercise.  She experiences dizziness primarily when getting up quickly after sitting for a prolonged period, which she associates with a low heart rate. Her smartwatch alerts her when her heart rate drops below 40 beats per minute, occurring more frequently in the morning but sporadically throughout the day.  No chest tightness, pressure, shortness of breath,  or swelling. She also reports no episodes of heart racing, dizziness leading to syncope, or shortness of breath during daily activities or exercise.  Her blood pressure readings are typically below 130, although it was noted to be slightly elevated during today's visit. She has not experienced any significant symptoms of hypotension.  She is not currently on any medications for her heart condition but was previously on nadolol , which she tolerated well. She recalls taking her medications at night, alongside her vitamins, to minimize any initial side effects.    Objective    Studies Reviewed: Aaron Aas   EKG Interpretation Date/Time:  Monday Dec 28 2023 15:34:44 EDT Ventricular Rate:  100 PR Interval:  172 QRS Duration:  86 QT Interval:  392 QTC Calculation: 505 R Axis:   -10  Text Interpretation: Sinus rhythm with frequent and consecutive Premature ventricular complexes Possible Left atrial enlargement Minimal voltage criteria for LVH, may be normal variant ( R in aVL ) Prolonged QT When compared with ECG of 03-Jul-2018 07:06, Premature ventricular complexes are now Present Nonspecific T wave abnormality no longer evident in Inferior leads Confirmed by Randene Bustard ([id number]) on 12/28/2023 3:40:33 PM    ECHO7/19/2021: EF 5055% with normal morphology.  No RWMA.  Normal RV.  Otherwise normal study. Exercise Treadmill Stress Test 09/08/2018: Normal blood pressure and heart rate response to exercise.  No ventricular ectopy with exercise.  No ischemic changes.  Lab Results  Component 06/2018 11/25/2021 ([facility])   CHOL 157 215   HDL 42 53   LDLCALC  96 123   TRIG 94 322    Risk Assessment/Calculations:           Physical Exam:   VS:  BP (!) 140/86   Pulse 100   Ht 5\' 6"  (1.676 m)   Wt 248 lb (112.5 kg)   SpO2 98%   BMI 40.03 kg/m    Wt Readings from Last 3 Encounters:  12/28/23 248 lb (112.5 kg)  10/31/21 244 lb 1.6 oz (110.7 kg)  02/04/21 230 lb (104.3 kg)    GEN: Well nourished, well  groomed in no acute distress; morbidly obese NECK: No JVD; No carotid bruits CARDIAC: Normal S1, S2; RRR, no murmurs, rubs, gallops RESPIRATORY:  Clear to auscultation without rales, wheezing or rhonchi ; nonlabored, good air movement. ABDOMEN: Soft, non-tender, non-distended EXTREMITIES:  No edema; No deformity      ASSESSMENT AND PLAN: .    Problem List Items Addressed This Visit       Cardiology Problems   Hyperlipidemia with target LDL less than 130   Depending on PVC burden, may want to consider ischemic assessment further management risk stratification and reassessment of lipids to discuss potentially reducing goal LDL.      Relevant Medications   nadolol  (CORGARD ) 20 MG tablet   NSVT (nonsustained ventricular tachycardia) (HCC) - Primary   She is not feeling sustained or prolonged palpitation episodes, but is having bigeminy on EKG here today with couplets. Check Zio patch monitor while on beta-blocker.      Relevant Medications   nadolol  (CORGARD ) 20 MG tablet   Other Relevant Orders   EKG 12-Lead (Completed)   LONG TERM MONITOR (3-14 DAYS)   PVC's (premature ventricular contractions)   Significant PVCs on EKG potentially decreasing cardiac function.  Previously managed with nadolol , which was well-tolerated. Controlling PVCs is crucial to prevent long-term cardiac issues. - Restart nadolol  at half previous dose for two weeks, then increase to full dose if tolerated. - After two months on nadolol , order Zio patch monitor for two weeks to assess PVC frequency and control. - Follow up in October to evaluate treatment efficacy and adjust as necessary.  She has noted issues with low heart rates-dropping to 40 bpm, dizziness upon standing. Nadolol  may impact heart rate but not expected to cause hypotension. - Monitor heart rate and symptoms after restarting nadolol . - Advise taking nadolol  at night to minimize daytime fatigue and dizziness.      Relevant Medications    nadolol  (CORGARD ) 20 MG tablet   Other Relevant Orders   EKG 12-Lead (Completed)   LONG TERM MONITOR (3-14 DAYS)     Other   Elevated blood pressure reading   Somewhat anxious going to see a new cardiologist.   We are starting nadolol  for her palpitations/PVCs which are probably lower blood pressure.            Follow-Up: Return in about 5 months (around 05/29/2024).     Signed, Arleen Lacer, MD, MS Randene Bustard, M.D., M.S. Interventional Chartered certified accountant  Pager # [phone]

## 2023-12-28 NOTE — Patient Instructions (Addendum)
 Medication Instructions:   Nadolol   for 2 weeks  take 1/2 tablet daily ,then increase to 1 tablet daily   *If you need a refill on your cardiac medications before your next appointment, please call your pharmacy*   Lab Work: Not needed    Testing/Procedures:    After being on Nadolol   for 2 months wear 14 monitor  ( @July  [phone])   Your physician has recommended that you wear a holter monitor 14 day ZIO . Holter monitors are medical devices that record the heart's electrical activity. Doctors most often use these monitors to diagnose arrhythmias. Arrhythmias are problems with the speed or rhythm of the heartbeat. The monitor is a small, portable device. You can wear one while you do your normal daily activities. This is usually used to diagnose what is causing palpitations/syncope (passing out).     Follow-Up: At [facility], you and your health needs are our priority.  As part of our continuing mission to provide you with exceptional heart care, we have created designated Provider Care Teams.  These Care Teams include your primary Cardiologist (physician) and Advanced Practice Providers (APPs -  Physician Assistants and Nurse Practitioners) who all work together to provide you with the care you need, when you need it.     Your next appointment:   5 month(s)  Oct 2025  The format for your next appointment:   In Person  Provider:   Randene Bustard, MD   Other Instructions   ZIO XT- Long Term Monitor Instructions  Your physician has requested you wear a ZIO patch monitor for 14 days.  This is a single patch monitor. [employer] supplies one patch monitor per enrollment. Additional stickers are not available. Please do not apply patch if you will be having a Nuclear Stress Test,  Echocardiogram, Cardiac CT, MRI, or Chest Xray during the period you would be wearing the  monitor. The patch cannot be worn during these tests. You cannot remove and re-apply the  ZIO XT patch  monitor.  Your ZIO patch monitor will be mailed 3 day USPS to your address on file. It may take 3-5 days  to receive your monitor after you have been enrolled.  Once you have received your monitor, please review the enclosed instructions. Your monitor  has already been registered assigning a specific monitor serial # to you.  Billing and Patient Assistance Program Information  We have supplied [employer] with any of your insurance information on file for billing purposes. [employer] offers a sliding scale Patient Assistance Program for patients that do not have  insurance, or whose insurance does not completely cover the cost of the ZIO monitor.  You must apply for the Patient Assistance Program to qualify for this discounted rate.  To apply, please call [employer] at [phone], select option 4, select option 2, ask to apply for  Patient Assistance Program. Sanna Crystal will ask your household income, and how many people  are in your household. They will quote your out-of-pocket cost based on that information.  [employer] will also be able to set up a 62-month, interest-free payment plan if needed.  Applying the monitor   Shave hair from upper left chest.  Hold abrader disc by orange tab. Rub abrader in 40 strokes over the upper left chest as  indicated in your monitor instructions.  Clean area with 4 enclosed alcohol pads. Let dry.  Apply patch as indicated in monitor instructions. Patch will be placed under collarbone on left  side of  chest with arrow pointing upward.  Rub patch adhesive wings for 2 minutes. Remove white label marked "1". Remove the white  label marked "2". Rub patch adhesive wings for 2 additional minutes.  While looking in a mirror, press and release button in center of patch. A small green light will  flash 3-4 times. This will be your only indicator that the monitor has been turned on.  Do not shower for the first 24 hours. You may shower after the first 24 hours.  Press the  button if you feel a symptom. You will hear a small click. Record Date, Time and  Symptom in the Patient Logbook.  When you are ready to remove the patch, follow instructions on the last 2 pages of Patient  Logbook. Stick patch monitor onto the last page of Patient Logbook.  Place Patient Logbook in the blue and white box. Use locking tab on box and tape box closed  securely. The blue and white box has prepaid postage on it. Please place it in the mailbox as  soon as possible. Your physician should have your test results approximately 7 days after the  monitor has been mailed back to [facility].  Call [facility] Customer Care at [phone] if you have questions regarding  your ZIO XT patch monitor. Call them immediately if you see an orange light blinking on your  monitor.  If your monitor falls off in less than 4 days, contact our Monitor department at [phone].  If your monitor becomes loose or falls off after 4 days call [employer] at [phone] for  suggestions on securing your monitor

## 2024-01-02 ENCOUNTER — Encounter: Payer: Self-pay | Admitting: Cardiology

## 2024-01-02 DIAGNOSIS — E785 Hyperlipidemia, unspecified: Secondary | ICD-10-CM | POA: Insufficient documentation

## 2024-01-02 DIAGNOSIS — R030 Elevated blood-pressure reading, without diagnosis of hypertension: Secondary | ICD-10-CM | POA: Insufficient documentation

## 2024-01-02 NOTE — Assessment & Plan Note (Signed)
 Significant PVCs on EKG potentially decreasing cardiac function.  Previously managed with nadolol , which was well-tolerated. Controlling PVCs is crucial to prevent long-term cardiac issues. - Restart nadolol  at half previous dose for two weeks, then increase to full dose if tolerated. - After two months on nadolol , order Zio patch monitor for two weeks to assess PVC frequency and control. - Follow up in October to evaluate treatment efficacy and adjust as necessary.  She has noted issues with low heart rates-dropping to 40 bpm, dizziness upon standing. Nadolol  may impact heart rate but not expected to cause hypotension. - Monitor heart rate and symptoms after restarting nadolol . - Advise taking nadolol  at night to minimize daytime fatigue and dizziness.

## 2024-01-02 NOTE — Assessment & Plan Note (Signed)
 She is not feeling sustained or prolonged palpitation episodes, but is having bigeminy on EKG here today with couplets. Check Zio patch monitor while on beta-blocker.

## 2024-01-02 NOTE — Assessment & Plan Note (Signed)
 Somewhat anxious going to see a new cardiologist.   We are starting nadolol  for her palpitations/PVCs which are probably lower blood pressure.

## 2024-01-02 NOTE — Assessment & Plan Note (Signed)
 Depending on PVC burden, may want to consider ischemic assessment further management risk stratification and reassessment of lipids to discuss potentially reducing goal LDL.

## 2024-02-23 HISTORY — PX: OTHER SURGICAL HISTORY: SHX169

## 2024-03-20 ENCOUNTER — Ambulatory Visit: Payer: Self-pay | Admitting: Cardiology

## 2024-03-20 DIAGNOSIS — I493 Ventricular premature depolarization: Secondary | ICD-10-CM

## 2024-03-20 DIAGNOSIS — I4729 Other ventricular tachycardia: Secondary | ICD-10-CM

## 2024-03-20 DIAGNOSIS — R9431 Abnormal electrocardiogram [ECG] [EKG]: Secondary | ICD-10-CM

## 2024-03-20 NOTE — Progress Notes (Signed)
~  14-day Zio patch monitor (June-July 2025)   Predominant underlying rhythm is sinus rhythm with a rate range of 44-1 7016 bpm with an average of 66 bpm.   Rare PACs with couplets triplets noted.   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  Ventricular bigeminy and trigeminy lasting up to 4 minutes of bigeminy and 15-1/2 minutes of trigeminy noted.   Frequent (116) Brief Runs of Ventricular Tachycardia (4+ PVCs): Fastest was 13 beats with a max rate of 194 bpm and longest was 10 beats with an average rate of 123 bpm.   2 Atrial Runs with the fastest and longest being 17 beats (8.1 seconds) with a max rate of 140 bpm, and average of 128 bpm.     Abnormal monitor with frequent PVCs and runs of VT although some of the VT runs appear to be aberrantly conducted SVT-more narrow complex.   With this level of PVCs and short runs of VT, I think it is best for him to be referred back to EP.  She was previously seen by Dr. Lynwood Rakers.  Would like to refer back to EP for assistance with management and further evaluation.  She previously had nonischemic treadmill stress test and normal echocardiogram.  In the absence of any active angina symptoms, I think probably hold off on further ischemic evaluation but would recommend EP evaluation to determine the next step for management.   Alm Clay, MD

## 2024-03-25 NOTE — Telephone Encounter (Signed)
-----   Message from Alm Clay sent at 03/20/2024  4:31 PM EDT -----  ~14-day Zio patch monitor (June-July 2025)   Predominant underlying rhythm is sinus rhythm with a rate range of 44-1 7016 bpm with an average of 66 bpm.   Rare PACs with couplets triplets noted.   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  Ventricular bigeminy and trigeminy lasting up to 4 minutes of bigeminy and 15-1/2 minutes of trigeminy noted.   Frequent (116) Brief Runs of Ventricular Tachycardia (4+ PVCs): Fastest was 13 beats with a max rate of 194 bpm and longest was 10 beats with an average rate of 123 bpm.   2 Atrial Runs with the fastest and longest being 17 beats (8.1 seconds) with a max rate of 140 bpm, and average of 128 bpm.     Abnormal monitor with frequent PVCs and runs of VT although some of the VT runs appear to be aberrantly conducted SVT-more narrow complex.   With this level of PVCs and short runs of VT, I think it is best for him to be referred back to EP.  She was previously seen by Dr. Lynwood Rakers.  Would like to refer back to EP for assistance with  management and further evaluation.  She previously had nonischemic treadmill stress test and normal echocardiogram.  In the absence of any active angina symptoms, I think probably hold off on further  ischemic evaluation but would recommend EP evaluation to determine the next step for management.   Alm Clay, MD   ----- Message ----- From: Clay Alm ORN, MD Sent: 03/20/2024   4:26 PM EDT To: Alm ORN Clay, MD

## 2024-03-25 NOTE — Telephone Encounter (Signed)
 REVIEWED  VIA MYCHART. REFERRAL PLACED FOR EP. PATIENT AWARE

## 2024-04-12 NOTE — Progress Notes (Signed)
 Electrophysiology Office Note:    Date:  04/13/2024   ID:  Rhonda Duncan, DOB [birth year], MRN [id number]  PCP:  Aisha Harvey, MD   [facility] Providers Cardiologist:  Alm Clay, MD     Referring MD: Clay Alm ORN, MD   History of Present Illness:    Rhonda Duncan is a 48 y.o. female with a medical history significant for PVCs, nonsustained VT, referred for arrhythmia management.     She was seen by Dr. Kelsie for history of PVCs and nonsustained VT managed with nadolol .  She reestablish care with Dr. Clay in May 2025 and was off nadolol  at that time.  During that visit EKG showed PVCs and bigeminy.  Nadolol  was restarted and monitor was placed several weeks after.  It showed   Discussed the use of AI scribe software for clinical note transcription with the patient, who gave verbal consent to proceed.  History of Present Illness Rhonda Duncan is a 48 year old female with a history of PVCs and non-sustained VT who presents for follow-up of her cardiac condition.  Nadolol  was restarted in May 2025 after an EKG showed PVCs and bigeminy. A monitor placed four weeks after restarting nadolol  showed a 14.7% burden of PVCs, sometimes occurring with non-sustained VT in bigeminy with sinus rhythm.  She experiences dizziness when getting up after sitting for extended periods, such as driving or sitting at her desk. No chest pain or shortness of breath. Initially, nadolol  at 10 mg improved her symptoms, but increasing to 20 mg led to a recurrence of dizziness. Reducing the dose back to 10 mg alleviated the dizziness.  Her heart rate drops to the 40s, particularly when inactive. She uses an Apple Watch to monitor her pulse. She has been on 20 mg of nadolol  during the monitoring period but has since reduced to 10 mg, which she feels has improved her symptoms.         Today, she is doing well and has no complaints  EKGs/Labs/Other  Studies Reviewed Today:     Echocardiogram:  Ordered Results pending   Monitors:  14 day monitor July 2025-- my interpretation Heart rate 44 217 bpm, average 66 bpm.  14.7% PVC burden Symptom episodes correlate with PVCs.  1 episode showed ventricular couplets and triplets occurring in bigeminy with sinus rhythm.  Multiple PVC morphologies present.   EKG:   EKG Interpretation Date/Time:  Wednesday April 13 2024 09:30:10 EDT Ventricular Rate:  72 PR Interval:  158 QRS Duration:  86 QT Interval:  430 QTC Calculation: 470 R Axis:   0  Text Interpretation: Normal sinus rhythm Nonspecific T wave abnormality Prolonged QT When compared with ECG of 28-Dec-2023 15:34, Premature ventricular complexes are no longer Present T wave inversion now evident in Anterior leads Confirmed by Nancey Scotts [phone]) on 04/13/2024 9:46:12 AM     Physical Exam:    VS:  BP 120/64 (BP Location: Right Arm, Patient Position: Sitting, Cuff Size: Large)   Pulse 72   Ht 5' 6 (1.676 m)   Wt 248 lb (112.5 kg)   SpO2 98%   BMI 40.03 kg/m     Wt Readings from Last 3 Encounters:  04/13/24 248 lb (112.5 kg)  12/28/23 248 lb (112.5 kg)  10/31/21 244 lb 1.6 oz (110.7 kg)     GEN: Well nourished, well developed in no acute distress CARDIAC: RRR, no murmurs, rubs, gallops RESPIRATORY:  Normal work of breathing MUSCULOSKELETAL: no edema  ASSESSMENT & PLAN:     Frequent PVCs Improved with nadolol  Recheck echocardiogram Check TSH, electrolytes Switch magnesium supplement to taurate Continue nadalol 10mg  -- can take an extra 10 mg if having increased symptoms Her PVCs are often clustered --occurring relatively rarely but in bigeminy when they do occur --making her a poor candidate for ablation  Low heart rates Uncertain whether this is due to sinus bradycardia or apical pulse dissociation from PVCs Tends to be worse at higher doses of beta-blocker  Follow-up 3 months   Signed, Eulas FORBES Furbish, MD  04/13/2024 10:11 AM    [facility]

## 2024-04-13 ENCOUNTER — Ambulatory Visit: Attending: Cardiovascular Disease | Admitting: Cardiovascular Disease

## 2024-04-13 ENCOUNTER — Encounter: Payer: Self-pay | Admitting: Cardiovascular Disease

## 2024-04-13 VITALS — BP 120/64 | HR 72 | Ht 66.0 in | Wt 248.0 lb

## 2024-04-13 DIAGNOSIS — I493 Ventricular premature depolarization: Secondary | ICD-10-CM

## 2024-04-13 DIAGNOSIS — E039 Hypothyroidism, unspecified: Secondary | ICD-10-CM | POA: Diagnosis not present

## 2024-04-13 DIAGNOSIS — R9431 Abnormal electrocardiogram [ECG] [EKG]: Secondary | ICD-10-CM | POA: Diagnosis not present

## 2024-04-13 DIAGNOSIS — Z79899 Other long term (current) drug therapy: Secondary | ICD-10-CM

## 2024-04-13 DIAGNOSIS — I4729 Other ventricular tachycardia: Secondary | ICD-10-CM

## 2024-04-13 NOTE — Patient Instructions (Signed)
 Medication Instructions:  Your physician has recommended you make the following change in your medication:   ** Begin Magnesium Taurate 400mg  daily - Found on Amazon  *If you need a refill on your cardiac medications before your next appointment, please call your pharmacy*  Lab Work: TSH and BMET today If you have labs (blood work) drawn today and your tests are completely normal, you will receive your results only by: MyChart Message (if you have MyChart) OR A paper copy in the mail If you have any lab test that is abnormal or we need to change your treatment, we will call you to review the results.  Testing/Procedures: Your physician has requested that you have an echocardiogram. Echocardiography is a painless test that uses sound waves to create images of your heart. It provides your doctor with information about the size and shape of your heart and how well your heart's chambers and valves are working. This procedure takes approximately one hour. There are no restrictions for this procedure. Please do NOT wear cologne, perfume, aftershave, or lotions (deodorant is allowed). Please arrive 15 minutes prior to your appointment time.  Please note: We ask at that you not bring children with you during ultrasound (echo/ vascular) testing. Due to room size and safety concerns, children are not allowed in the ultrasound rooms during exams. Our front office staff cannot provide observation of children in our lobby area while testing is being conducted. An adult accompanying a patient to their appointment will only be allowed in the ultrasound room at the discretion of the ultrasound technician under special circumstances. We apologize for any inconvenience.   Follow-Up: At [facility], you and your health needs are our priority.  As part of our continuing mission to provide you with exceptional heart care, our providers are all part of one team.  This team includes your primary  Cardiologist (physician) and Advanced Practice Providers or APPs (Physician Assistants and Nurse Practitioners) who all work together to provide you with the care you need, when you need it.  Your next appointment:   3 months with Dr Mealor

## 2024-04-14 ENCOUNTER — Ambulatory Visit: Payer: Self-pay | Admitting: *Deleted

## 2024-04-14 LAB — BASIC METABOLIC PANEL WITH GFR
BUN/Creatinine Ratio: 24 — ABNORMAL HIGH (ref 9–23)
BUN: 19 mg/dL (ref 6–24)
CO2: 18 mmol/L — ABNORMAL LOW (ref 20–29)
Calcium: 9.2 mg/dL (ref 8.7–10.2)
Chloride: 105 mmol/L (ref 96–106)
Creatinine, Ser: 0.8 mg/dL (ref 0.57–1.00)
Glucose: 109 mg/dL — ABNORMAL HIGH (ref 70–99)
Potassium: 4.4 mmol/L (ref 3.5–5.2)
Sodium: 139 mmol/L (ref 134–144)
eGFR: 90 mL/min/1.73 (ref >59)

## 2024-04-14 LAB — TSH: TSH: 0.959 u[IU]/mL (ref 0.450–4.500)

## 2024-05-13 ENCOUNTER — Ambulatory Visit (HOSPITAL_COMMUNITY)
Admission: RE | Admit: 2024-05-13 | Discharge: 2024-05-13 | Disposition: A | Discharge status: Home / Self Care | Source: Ambulatory Visit | Attending: Cardiovascular Disease | Admitting: Cardiovascular Disease

## 2024-05-13 DIAGNOSIS — R9431 Abnormal electrocardiogram [ECG] [EKG]: Secondary | ICD-10-CM | POA: Diagnosis present

## 2024-05-13 DIAGNOSIS — I4729 Other ventricular tachycardia: Secondary | ICD-10-CM | POA: Insufficient documentation

## 2024-05-13 DIAGNOSIS — I493 Ventricular premature depolarization: Secondary | ICD-10-CM | POA: Diagnosis present

## 2024-05-13 HISTORY — PX: TRANSTHORACIC ECHOCARDIOGRAM: SHX275

## 2024-05-13 LAB — ECHOCARDIOGRAM COMPLETE
Area-P 1/2: 3.06 cm2
S' Lateral: 3.9 cm

## 2024-05-16 NOTE — Telephone Encounter (Signed)
 Spoke with pt and reviewed echo results and recommendation per Dr Nancey.  Pt is currently scheduled to see Dr Anner on 05/30/24.  Advised will have Dr Dwyane scheduler contact for sooner appointment.  Pt verbalizes understanding and agrees with current plan.

## 2024-05-16 NOTE — Telephone Encounter (Signed)
 Pt returning call

## 2024-05-16 NOTE — Telephone Encounter (Signed)
Attempted phone call to pt and left voicemail message to contact office at [phone]. 

## 2024-05-20 ENCOUNTER — Other Ambulatory Visit (HOSPITAL_BASED_OUTPATIENT_CLINIC_OR_DEPARTMENT_OTHER): Payer: Self-pay

## 2024-05-20 ENCOUNTER — Encounter: Payer: Self-pay | Admitting: Cardiovascular Disease

## 2024-05-20 ENCOUNTER — Ambulatory Visit: Attending: Cardiovascular Disease | Admitting: Cardiovascular Disease

## 2024-05-20 VITALS — BP 138/82 | HR 63 | Ht 66.0 in | Wt 251.0 lb

## 2024-05-20 DIAGNOSIS — I4729 Other ventricular tachycardia: Secondary | ICD-10-CM

## 2024-05-20 DIAGNOSIS — I493 Ventricular premature depolarization: Secondary | ICD-10-CM | POA: Diagnosis not present

## 2024-05-20 DIAGNOSIS — I429 Cardiomyopathy, unspecified: Secondary | ICD-10-CM | POA: Diagnosis not present

## 2024-05-20 MED ORDER — MEXILETINE HCL 150 MG PO CAPS
150.0000 mg | ORAL_CAPSULE | Freq: Three times a day (TID) | ORAL | 11 refills | Status: DC
  Filled 2024-05-20: qty 60, 20d supply, fill #0
  Filled 2024-06-07: qty 60, 20d supply, fill #1

## 2024-05-20 MED ORDER — SACUBITRIL-VALSARTAN 24-26 MG PO TABS
1.0000 | ORAL_TABLET | Freq: Two times a day (BID) | ORAL | 11 refills | Status: AC
  Filled 2024-05-20 (×2): qty 60, 30d supply, fill #0
  Filled 2024-06-13 – 2024-06-16 (×2): qty 60, 30d supply, fill #1
  Filled 2024-07-06 – 2024-07-09 (×2): qty 60, 30d supply, fill #2
  Filled 2024-08-03: qty 60, 30d supply, fill #3
  Filled 2024-09-09: qty 60, 30d supply, fill #4

## 2024-05-20 MED ORDER — METOPROLOL SUCCINATE ER 50 MG PO TB24
50.0000 mg | ORAL_TABLET | Freq: Every day | ORAL | 3 refills | Status: AC
  Filled 2024-05-20: qty 90, 90d supply, fill #0
  Filled 2024-07-06 – 2024-08-03 (×3): qty 90, 90d supply, fill #1

## 2024-05-20 NOTE — Progress Notes (Signed)
 Electrophysiology Office Note:    Date:  05/20/2024   ID:  Rhonda Duncan, DOB Jun 07, 1976, MRN [id number]  PCP:  Aisha Harvey, MD   [facility] Providers Cardiologist:  Alm Clay, MD     Referring MD: Aisha Harvey, MD   History of Present Illness:    Rhonda Duncan is a 48 y.o. female with a medical history significant for PVCs, nonsustained VT, referred for arrhythmia management.     She was seen by Dr. Kelsie for history of PVCs and nonsustained VT managed with nadolol .   History of Present Illness Rhonda Duncan is a 48 year old female with a history of PVCs and non-sustained VT who presents for follow-up of her cardiac condition.  Nadolol  was restarted in May 2025 after an EKG showed PVCs and bigeminy. A monitor placed four weeks after restarting nadolol  showed a 14.7% burden of PVCs, sometimes occurring with non-sustained VT in bigeminy with sinus rhythm.  She experiences dizziness when getting up after sitting for extended periods, such as driving or sitting at her desk. No chest pain or shortness of breath. Initially, nadolol  at 10 mg improved her symptoms, but increasing to 20 mg led to a recurrence of dizziness. Reducing the dose back to 10 mg alleviated the dizziness.  Her heart rate drops to the 40s, particularly when inactive. She uses an Apple Watch to monitor her pulse. She has been on 20 mg of nadolol  during the monitoring period but has since reduced to 10 mg, which she feels has improved her symptoms.  He returns for follow-up regarding her echocardiogram which showed significantly depressed ejection fraction, now 30 to 35%.  She is continue to have episodes of palpitations and has understandably been stressed by her diagnosis.          Today, she is doing well and has no complaints  EKGs/Labs/Other Studies Reviewed Today:     Echocardiogram:  TTE March 12, 2024 LVEF 30 to 35%.  Normal valve structure  and function.   Monitors:  14 day monitor July 2025-- my interpretation Heart rate 44 217 bpm, average 66 bpm.  14.7% PVC burden Symptom episodes correlate with PVCs.  1 episode showed ventricular couplets and triplets occurring in bigeminy with sinus rhythm.  Multiple PVC morphologies present.   EKG:   EKG Interpretation Date/Time:  Friday May 20 2024 14:55:07 EDT Ventricular Rate:  63 PR Interval:  154 QRS Duration:  90 QT Interval:  426 QTC Calculation: 435 R Axis:   -19  Text Interpretation: Normal sinus rhythm Moderate voltage criteria for LVH, may be normal variant ( R in aVL , Cornell product ) Cannot rule out Anterior infarct , age undetermined When compared with ECG of 13-Apr-2024 09:30, No significant change was found Confirmed by Nancey Scotts [phone]) on 05/20/2024 3:01:31 PM     Physical Exam:    VS:  BP 138/82   Pulse 63   Ht 5' 6 (1.676 m)   Wt 251 lb (113.9 kg)   SpO2 96%   BMI 40.51 kg/m     Wt Readings from Last 3 Encounters:  05/20/24 251 lb (113.9 kg)  04/13/24 248 lb (112.5 kg)  12/28/23 248 lb (112.5 kg)     GEN: Well nourished, well developed in no acute distress CARDIAC: RRR, no murmurs, rubs, gallops RESPIRATORY:  Normal work of breathing MUSCULOSKELETAL: no edema    ASSESSMENT & PLAN:     Frequent PVCs Improved with nadolol  Her PVCs are often  clustered --occurring relatively rarely but in bigeminy when they do occur --making her a poor candidate for ablation Switch from nadolol  to metoprolol  due to cardiomyopathy Start mexiletine 150 mg p.o. twice daily  CHFrEF  NYHA II symptoms Etiology unclear --PVC burden less than 15% is a little low to cause cardiomyopathy We will order cardiac MRI Switch from nadolol  20 mg to metoprolol  XL 50 mg daily Start Entresto  24-26 She will follow-up with Dr. Anner in a few days; will refer her to pharmacy for GDMT titration    Low heart rates Uncertain whether this is due to sinus  bradycardia or apical pulse dissociation from PVCs Tends to be worse at higher doses of beta-blocker    Follow-up 3 months   Signed, Eulas FORBES Furbish, MD  05/20/2024 3:02 PM    [facility]

## 2024-05-20 NOTE — Patient Instructions (Addendum)
 Medication Instructions:  Your physician has recommended you make the following change in your medication:   STOP NADOLOL   START taking metoprolol  succinate (Toprol  XL) 50 mg- Take one tablet by mouth daily.  START taking Entresto  (sacubitril /valsartan ) 24/26 mg-  Take one tablet by mouth TWICE a day.  START taking mexiletine 150 mg-  Take one tablet by mouth twice a day  Lab Work: You will need lab work prior to your cardiac MRI.  Hemoglobin & Hematocrit  Testing/Procedures: Your physician has requested that you have a cardiac MRI. Cardiac MRI uses a computer to create images of your heart as its beating, producing both still and moving pictures of your heart and major blood vessels. For further information please visit [email or web address]. Please follow the instruction sheet given to you today for more information.   Follow-Up: At [facility], you and your health needs are our priority.  As part of our continuing mission to provide you with exceptional heart care, our providers are all part of one team.  This team includes your primary Cardiologist (physician) and Advanced Practice Providers or APPs (Physician Assistants and Nurse Practitioners) who all work together to provide you with the care you need, when you need it.  Your next appointment:   3 month(s)  Provider:   Eulas Furbish, MD   We recommend signing up for the patient portal called MyChart.  Sign up information is provided on this After Visit Summary.  MyChart is used to connect with patients for Virtual Visits (Telemedicine).  Patients are able to view lab/test results, encounter notes, upcoming appointments, etc.  Non-urgent messages can be sent to your provider as well.   To learn more about what you can do with MyChart, go to [email or web address].     You are scheduled for Cardiac MRI at the location below.  Please arrive for your appointment at ______________ . ?  [facility] [address] [place], [place] [address] Please take advantage of the free valet parking available at the [facility] and [employer] (Entrance C).  Proceed to the [facility] Radiology Department (First Floor) for check-in.   OR   [facility] [address] [place], [place] [address] Please go to the [facility] and check-in with the desk attendant.   Magnetic resonance imaging (MRI) is a painless test that produces images of the inside of the body without using Xrays.  During an MRI, strong magnets and radio waves work together in a Data processing manager to form detailed images.   MRI images may provide more details about a medical condition than X-rays, CT scans, and ultrasounds can provide.  You may be given earphones to listen for instructions.  You may eat a light breakfast and take medications as ordered with the exception of furosemide, hydrochlorothiazide, chlorthalidone or spironolactone (or any other fluid pill). If you are undergoing a stress MRI, please avoid stimulants for 12 hr prior to test. (I.e. Caffeine, nicotine, chocolate, or antihistamine medications)  If your provider has ordered anti-anxiety medications for this test, then you will need a driver.  An IV will be inserted into one of your veins. Contrast material will be injected into your IV. It will leave your body through your urine within a day. You may be told to drink plenty of fluids to help flush the contrast material out of your system.  You will be asked to remove all metal, including: Watch, jewelry, and other metal objects including hearing aids, hair  pieces and dentures. Also wearable glucose monitoring systems (ie. Freestyle Libre and Omnipods) (Braces and fillings normally are not a problem.)   TEST WILL TAKE APPROXIMATELY 1 HOUR  PLEASE NOTIFY SCHEDULING AT LEAST 24 HOURS IN ADVANCE IF YOU ARE UNABLE TO KEEP YOUR APPOINTMENT. [phone]  For more information and frequently  asked questions, please visit our website : [email or web address]  Please call the Cardiac Imaging Nurse Navigators with any questions/concerns. [phone] Office

## 2024-05-22 ENCOUNTER — Emergency Department (HOSPITAL_BASED_OUTPATIENT_CLINIC_OR_DEPARTMENT_OTHER)

## 2024-05-22 ENCOUNTER — Other Ambulatory Visit: Payer: Self-pay

## 2024-05-22 ENCOUNTER — Emergency Department (HOSPITAL_BASED_OUTPATIENT_CLINIC_OR_DEPARTMENT_OTHER)
Admission: EM | Admit: 2024-05-22 | Discharge: 2024-05-23 | Disposition: A | Discharge status: Home / Self Care | Attending: Emergency Medicine | Admitting: Emergency Medicine

## 2024-05-22 ENCOUNTER — Encounter (HOSPITAL_BASED_OUTPATIENT_CLINIC_OR_DEPARTMENT_OTHER): Payer: Self-pay

## 2024-05-22 DIAGNOSIS — R0789 Other chest pain: Secondary | ICD-10-CM | POA: Insufficient documentation

## 2024-05-22 LAB — CBC
HCT: 42.2 % (ref 36.0–46.0)
Hemoglobin: 14.2 g/dL (ref 12.0–15.0)
MCH: 30.5 pg (ref 26.0–34.0)
MCHC: 33.6 g/dL (ref 30.0–36.0)
MCV: 90.6 fL (ref 80.0–100.0)
Platelets: 272 K/uL (ref 150–400)
RBC: 4.66 MIL/uL (ref 3.87–5.11)
RDW: 12.5 % (ref 11.5–15.5)
WBC: 8.7 K/uL (ref 4.0–10.5)
nRBC: 0 % (ref 0.0–0.2)

## 2024-05-22 NOTE — ED Triage Notes (Signed)
 Pt c/o CP, indigestion which is what I originally thought it was, L arm numbness onset tonight. States that CP was intermittent this evening, L arm numbness just started within the hour. Denies NV, radiation into jaw/ back.  Seen at cards Friday, changing meds- I'm now on a new 50mg  beta blocker, 2 other meds that haven't been filled yet.

## 2024-05-23 ENCOUNTER — Other Ambulatory Visit (HOSPITAL_BASED_OUTPATIENT_CLINIC_OR_DEPARTMENT_OTHER): Payer: Self-pay

## 2024-05-23 LAB — BASIC METABOLIC PANEL WITH GFR
Anion gap: 14 (ref 5–15)
BUN: 22 mg/dL — ABNORMAL HIGH (ref 6–20)
CO2: 23 mmol/L (ref 22–32)
Calcium: 9.6 mg/dL (ref 8.9–10.3)
Chloride: 103 mmol/L (ref 98–111)
Creatinine, Ser: 0.84 mg/dL (ref 0.44–1.00)
GFR, Estimated: >60 mL/min (ref >60)
Glucose, Bld: 120 mg/dL — ABNORMAL HIGH (ref 70–99)
Potassium: 3.8 mmol/L (ref 3.5–5.1)
Sodium: 140 mmol/L (ref 135–145)

## 2024-05-23 LAB — TROPONIN T, HIGH SENSITIVITY
Troponin T High Sensitivity: <15 ng/L (ref 0–19)
Troponin T High Sensitivity: <15 ng/L (ref 0–19)

## 2024-05-23 NOTE — Discharge Instructions (Signed)
 You were seen today for chest pain.  Your workup today is reassuring.  Follow-up with cardiology for definitive testing.  Call your cardiologist this week to set up an appointment.  If you have any new or worsening symptoms including shortness of breath, chest pain, you should be reevaluated.

## 2024-05-23 NOTE — ED Provider Notes (Signed)
 [place] EMERGENCY DEPARTMENT AT [facility] Provider Note   CSN: [id number] Arrival date & time: 05/22/24  2253     Patient presents with: Chest Pain   Rhonda Duncan is a 48 y.o. female.   HPI     This is a 48 year old female with a history of frequent PVCs and nonsustained VT on metoprolol  who presents with chest pain.  Patient reports chest pain onset late afternoon.  Initially she thought it was indigestion and then she began to have some left arm numbness which was new.  Comes and goes.  Nonexertional.  No nausea, vomiting, jaw pain.  No known history of coronary artery disease.  She was seen by cardiology on Friday and had some medication adjustments.  She has never had a cardiac catheterization.  Prior to Admission medications   Medication Sig Start Date End Date Taking? Authorizing Provider  acetaminophen  (TYLENOL ) 325 MG tablet Take 325-650 mg by mouth every 8 (eight) hours as needed for mild pain or headache.     [provider]  ibuprofen (ADVIL,MOTRIN) 200 MG tablet Take 200-400 mg by mouth every 8 (eight) hours as needed (for pain or discomfort).     [provider]  MAGNESIUM PO Take 400 mg by mouth daily.    [provider]  metoprolol  succinate (TOPROL  XL) 50 MG 24 hr tablet Take 1 tablet (50 mg total) by mouth daily. Take with or immediately following a meal. 05/20/24   Mealor, Augustus E, MD  mexiletine (MEXITIL ) 150 MG capsule Take 1 capsule (150 mg total) by mouth 3 (three) times daily. 05/20/24   Mealor, Augustus E, MD  Multiple Vitamin (MULTIVITAMINS PO) 1 capsule.    [provider]  sacubitril -valsartan  (ENTRESTO ) 24-26 MG Take 1 tablet by mouth 2 (two) times daily. 05/20/24   Mealor, Augustus E, MD  VITAMIN D, CHOLECALCIFEROL, PO Take 2 tablets by mouth daily.    [provider]    Allergies: Patient has no known allergies.    Review of Systems  Constitutional:  Negative for fever.  Respiratory:   Negative for shortness of breath.   Cardiovascular:  Positive for chest pain. Negative for leg swelling.  All other systems reviewed and are negative.   Updated Vital Signs BP (!) 151/101   Pulse 95   Temp 98.2 F (36.8 C)   Resp 16   SpO2 99%   Physical Exam Vitals and nursing note reviewed.  Constitutional:      Appearance: She is well-developed. She is not ill-appearing.  HENT:     Head: Normocephalic and atraumatic.  Eyes:     Pupils: Pupils are equal, round, and reactive to light.  Cardiovascular:     Rate and Rhythm: Normal rate and regular rhythm.     Heart sounds: Normal heart sounds.  Pulmonary:     Effort: Pulmonary effort is normal. No respiratory distress.     Breath sounds: No wheezing.  Abdominal:     General: Bowel sounds are normal.     Palpations: Abdomen is soft.  Musculoskeletal:     Cervical back: Neck supple.  Skin:    General: Skin is warm and dry.  Neurological:     Mental Status: She is alert and oriented to person, place, and time.  Psychiatric:        Mood and Affect: Mood normal.     (all labs ordered are listed, but only abnormal results are displayed) Labs Reviewed  BASIC METABOLIC PANEL WITH GFR -  Abnormal; Notable for the following components:      Result Value   Glucose, Bld 120 (*)    BUN 22 (*)    All other components within normal limits  CBC  PREGNANCY, URINE  TROPONIN T, HIGH SENSITIVITY  TROPONIN T, HIGH SENSITIVITY    EKG: EKG Interpretation Date/Time:  Sunday May 22 2024 23:07:05 EDT Ventricular Rate:  89 PR Interval:  172 QRS Duration:  86 QT Interval:  382 QTC Calculation: 464 R Axis:   -31  Text Interpretation: Normal sinus rhythm Possible Left atrial enlargement Left axis deviation Left ventricular hypertrophy ( R in aVL , Cornell product ) Nonspecific ST and T wave abnormality Abnormal ECG When compared with ECG of 20-May-2024 14:55, No significant change was found similar to prior Confirmed by Bari Pfeiffer ([id number]) on 05/22/2024 11:21:51 PM  Radiology: [facility] Chest Port 1 View Result Date: 05/22/2024 CLINICAL DATA:  Chest pain, left arm numbness EXAM: PORTABLE CHEST 1 VIEW COMPARISON:  07/02/2018 FINDINGS: The heart size and mediastinal contours are within normal limits. Both lungs are clear. The visualized skeletal structures are unremarkable. IMPRESSION: No active disease. Electronically Signed   By: Ozell Daring M.D.   On: 05/22/2024 23:52     Procedures   Medications Ordered in the ED - No data to display                                  Medical Decision Making Amount and/or Complexity of Data Reviewed Labs: ordered. Radiology: ordered.   This patient presents to the ED for concern of chest pain, this involves an extensive number of treatment options, and is a complaint that carries with it a high risk of complications and morbidity.  I considered the following differential and admission for this acute, potentially life threatening condition.  The differential diagnosis includes ACS, PE, pneumothorax,, arrhythmia  MDM:    This is a 48 year old female who presents with chest pain.  She is nontoxic vital signs are notable for blood pressure 151/101.  Initially thought it was her indigestion.  EKG shows no evidence of acute ischemia or arrhythmia.PERC negative for PE and satting 99% on room air.  Troponin x 2 negative.  Given duration of symptoms, feel that this is very reassuring.  Basic lab work is otherwise reassuring.  Patient has a cardiologist.  Would recommend follow-up closely for ischemic evaluation.  She has had prior electrophysiology evaluation but no ischemic workup.  Patient reassured and is agreeable to plan.  (Labs, imaging, consults)  Labs: I Ordered, and personally interpreted labs.  The pertinent results include: CBC, BMP, troponin x 2  Imaging Studies ordered: I ordered imaging studies including chest x-ray I independently visualized and interpreted  imaging. I agree with the radiologist interpretation  Additional history obtained from chart review.  External records from outside source obtained and reviewed including prior evaluations  Cardiac Monitoring: The patient was maintained on a cardiac monitor.  If on the cardiac monitor, I personally viewed and interpreted the cardiac monitored which showed an underlying rhythm of: Sinus  Reevaluation: After the interventions noted above, I reevaluated the patient and found that they have :stayed the same  Social Determinants of Health:  lives independently  Disposition: Discharge  Co morbidities that complicate the patient evaluation  Past Medical History:  Diagnosis Date   Ventricular tachycardia (HCC)      Medicines No orders of the defined types were  placed in this encounter.   I have reviewed the patients home medicines and have made adjustments as needed  Problem List / ED Course: Problem List Items Addressed This Visit   None Visit Diagnoses       Atypical chest pain    -  Primary                Final diagnoses:  Atypical chest pain    ED Discharge Orders     None          Bari Charmaine FALCON, MD 05/23/24 [phone]

## 2024-05-24 ENCOUNTER — Encounter (HOSPITAL_BASED_OUTPATIENT_CLINIC_OR_DEPARTMENT_OTHER): Payer: Self-pay

## 2024-05-24 ENCOUNTER — Telehealth: Payer: Self-pay | Admitting: Pharmacy Technician

## 2024-05-24 ENCOUNTER — Other Ambulatory Visit (HOSPITAL_BASED_OUTPATIENT_CLINIC_OR_DEPARTMENT_OTHER): Payer: Self-pay

## 2024-05-24 ENCOUNTER — Other Ambulatory Visit (HOSPITAL_COMMUNITY): Payer: Self-pay

## 2024-05-24 NOTE — Telephone Encounter (Signed)
  NEEDS PA ON BRAND AND GENERIC   Pharmacy Patient Advocate Encounter   Received notification from CoverMyMeds that prior authorization for SACUBITRIL /VALSARTAN  is required/requested.   Insurance verification completed.   The patient is insured through [employer] [facility] .   Per test claim: PA required; PA submitted to above mentioned insurance via Latent Key/confirmation #/EOC [id number] Status is pending

## 2024-05-24 NOTE — Telephone Encounter (Signed)
 Pharmacy Patient Advocate Encounter  Received notification from [employer] [facility] that Prior Authorization for sacubitril .valsartan  has been APPROVED from 05/24/24 to 05/24/25   PA #/Case ID/Reference #: [id number]

## 2024-05-25 ENCOUNTER — Encounter: Payer: Self-pay | Admitting: Cardiovascular Disease

## 2024-05-30 ENCOUNTER — Ambulatory Visit: Attending: Cardiology | Admitting: Cardiology

## 2024-05-30 ENCOUNTER — Encounter: Payer: Self-pay | Admitting: Cardiology

## 2024-05-30 VITALS — BP 128/82 | HR 53 | Ht 66.0 in | Wt 245.8 lb

## 2024-05-30 DIAGNOSIS — I4729 Other ventricular tachycardia: Secondary | ICD-10-CM | POA: Diagnosis not present

## 2024-05-30 DIAGNOSIS — R030 Elevated blood-pressure reading, without diagnosis of hypertension: Secondary | ICD-10-CM | POA: Diagnosis not present

## 2024-05-30 DIAGNOSIS — Z9189 Other specified personal risk factors, not elsewhere classified: Secondary | ICD-10-CM | POA: Insufficient documentation

## 2024-05-30 DIAGNOSIS — E785 Hyperlipidemia, unspecified: Secondary | ICD-10-CM

## 2024-05-30 DIAGNOSIS — I493 Ventricular premature depolarization: Secondary | ICD-10-CM | POA: Diagnosis not present

## 2024-05-30 DIAGNOSIS — I420 Dilated cardiomyopathy: Secondary | ICD-10-CM | POA: Insufficient documentation

## 2024-05-30 NOTE — Patient Instructions (Addendum)
 Pisses me off medication Instructions:   Not needed *If you need a refill on your cardiac medications before your next appointment, please call your pharmacy*   Lab Work: CMP LIPID BNP TSH  If you have labs (blood work) drawn today and your tests are completely normal, you will receive your results only by: MyChart Message (if you have MyChart) OR A paper copy in the mail If you have any lab test that is abnormal or we need to change your treatment, we will call you to review the results.   Testing/Procedures: Not needed   Follow-Up: At [facility], you and your health needs are our priority.  As part of our continuing mission to provide you with exceptional heart care, we have created designated Provider Care Teams.  These Care Teams include your primary Cardiologist (physician) and Advanced Practice Providers (APPs -  Physician Assistants and Nurse Practitioners) who all work together to provide you with the care you need, when you need it.     Your next appointment:   4 month(s)  The format for your next appointment:   Virtual Visit   Provider:   Alm Clay, MD   Other Instructions

## 2024-05-30 NOTE — Assessment & Plan Note (Signed)
 Blood pressures continue to be elevated when she saw Dr. Nancey.   Appropriately started on Entresto  which she is tolerating well. Beta-blocker converted from nadolol  to Toprol  which she is also tolerating well.

## 2024-05-30 NOTE — Assessment & Plan Note (Addendum)
 Dilated cardiomyopathy-only notable abnormality is frequent PVCs with bigeminy and short NSVT runs.  Dizziness with PVCs, especially on standing. No significant chest pain or exertional dyspnea. PVCs may reduce pump function.  MRI results to guide management, potential coronary CTA or catheterization if needed.  Non-invasive evaluation preferred unless symptoms worsen.  - Continue metoprolol  succinate 50 mg daily, and Entresto  24-26 mg twice daily.  - In the absence of any active CHF symptoms we will hold off on diuretic including spironolactone-mostly to avoid electrolyte or maladies.  - Consider adding SGLT2 inhibitor following cardiac MRI - Continue to titrate mexiletine to full dose as tolerated per EP. - Cardiac MRI scheduled for June 15, 2024. - Reassess with a monitor after full dose of mexiletine is achieved. - Consider coronary CTA Versus Cardiac Catheterization based on MRI results. - Ensure adequate hydration to prevent dizziness.  - Checking chemistry panel along with BNP and TSH today

## 2024-05-30 NOTE — Assessment & Plan Note (Signed)
 Significant PVC burden with bigeminy and couplets as well as short NSVT runs. No active angina or heart failure symptoms. Cardiac MRI ordered -- pending results will need to consider ischemic evaluation with a coronary CTA versus cardiac catheterization Heart rate today was measured at 33, but on my reassessment was more close to 50-55-discounting PVCs Was started on mexiletine plus converted to Toprol  by EP ->  continue Toprol  50 mg but monitor heart rate.   continue to titrate mexiletine 250 mg from 2 x daily to 3 x  daily

## 2024-05-30 NOTE — Assessment & Plan Note (Addendum)
 In light of her reduced EF on echo, concern for possible CAD etiology for PVCs and her family history making this more likely, we need to reassess her lipids and treat accordingly. - Check lipid panel along with chemistry panel today. Discussed treatment options and close follow-up after MRI.

## 2024-05-30 NOTE — Assessment & Plan Note (Signed)
 Family history of coronary artery disease. No current symptoms of coronary artery disease. Risk factors to be evaluated, including cholesterol and blood sugar. Coronary CTA considered if MRI is inconclusive. Non-invasive CTA preferred unless symptoms or test results indicate otherwise. - Order blood work to assess cholesterol levels and blood sugar. - Consider coronary CTA if MRI results are inconclusive.

## 2024-05-30 NOTE — Progress Notes (Signed)
 Cardiology Office Note:  .   Date:  05/30/2024  ID:  Rhonda Duncan, DOB Jul 19, 1976, MRN [id number] PCP: Aisha Harvey, MD  [facility] Providers Cardiologist:  Alm Clay, MD     Chief Complaint  Patient presents with   Follow-up   Cardiomyopathy    No heart failure symptoms.  Reduced EF noted on echo.  MRI pending.   Palpitations    PVCs    Patient Profile: .     Rhonda Duncan is a borderline morbidly obese 48 y.o. female with a PMH notable for frequent PVCs and brief runs of NSVT who presents here for close follow-up at the request of Aisha Harvey, MD.      Rhonda Duncan had been followed for a long time by Lynwood Rakers, MD from electrophysiology for her PVCs.  She had last been seen in 2023 until I saw her for the first time in May 2025 mostly because she was started to notice her PVCs more frequently.  She was also starting to notice that her Apple Watch was indicating low heart rates.  This was associated with some exertional but more so positional dizziness.  She really was not have any significant sensation of palpitations or irregular heartbeats at the time but they were getting more frequent/prominent over the last several months.  She denied any chest pain pressure dyspnea with rest or exertion.  No heart failure symptoms of PND orthopnea or edema.  She at that time was not on nadolol -had been previously on 20 mg which were restarted at 10 and then increased to 20 mg.  I ordered an event monitor that showed a pretty significant PVC burden and I referred her back to EP where she saw Dr. Deirdre Furbish.  She was just seen by Dr. Furbish from EP on 05/20/2024.  They noted that nadolol  was restarted in May 2025 and EKG showed PVCs and bigeminy monitor placed after starting nadolol  show the 14.7% burden of PVCs sometimes occurring with nonsustained VT also in bigeminy.  He noted some dizziness after after getting up at Dr. Court seated for long  periods.  No chest pain or pressure with rest or exertion.  No dyspnea at rest hydration.  She felt more dizzy and weak with increasing dose of nadolol  from 10-20 so she was reduced down to 10 mg.  Heart rates dropped to the 20s when inactive.  Unfortunately, her EF reduced to 30 to 35%.  The thought was that her PVCs would be poor candidate for ablation.  She was switched from nadolol  to metoprolol  XL 50 mg due to cardiomyopathy and was started on mexiletine 150 mg twice daily.  Cardiac MRI ordered.  Entresto  24 to 26 mg added.  Subjective  Discussed the use of AI scribe software for clinical note transcription with the patient, who gave verbal consent to proceed.  History of Present Illness Rhonda Duncan is a 48 year old female with premature ventricular contractions (PVCs) who presents for follow-up on her cardiac condition.  She has been experiencing issues with her heart rate, initially noticed when her watch indicated a low heart rate. Her average heart rate ranges from 60 to 100 beats per minute, reaching up to 130 during activity. She experiences dizziness and increased heart rate during episodes of frequent PVCs, which she finds concerning. No shortness of breath, chest tightness, or pressure during these episodes, except for one instance of chest discomfort and arm numbness, which was evaluated and attributed  to indigestion.  Her medication regimen has been adjusted recently. She was initially prescribed nadolol , which she could not tolerate at 20 mg and reduced to 10 mg. Recently, her medication was changed from nadolol  to metoprolol , and Entresto  was added. She is also taking mexiletine, currently at two pills of 50 mg twice a day, with plans to increase to three times a day. She is cautious about side effects and is monitoring her response to the medication.  She has not experienced symptoms of heart failure such as shortness of breath when lying flat, swelling in her legs,  or waking up short of breath.  Her family history is significant for cardiac issues, with her father having undergone a quintuple bypass at 63 and her grandfather having died of a heart attack at 16.  Cardiovascular ROS: no chest pain or dyspnea on exertion positive for - irregular heartbeat, palpitations, and positional dizziness.  Notably improved fatigue since stopping nadolol  and going to metoprolol  negative for - edema, orthopnea, paroxysmal nocturnal dyspnea, rapid heart rate, shortness of breath, or syncope or near syncope, TIA/CVA/amaurosis fugax, claudication.  ROS:  Review of Systems - Negative except symptoms noted above    Objective   Current Medications: Mexiletine 150 mg currently taking twice daily with plans to titrate to 3 times daily in the next couple weeks. Toprol -XL 50 mg daily Entresto  24 over 26 mg twice daily Magnesium 400 mg p.o.  Family History - Father: CAD-CABG x 5  - Paternal grandfather: deceased at age 56 due to sudden heart attack   Studies Reviewed: SABRA        Lab Results  Component Value Date   NA 140 05/22/2024   K 3.8 05/22/2024   CREATININE 0.84 05/22/2024   GFRNONAA >60 05/22/2024   GLUCOSE 120 (H) 05/22/2024      Latest Ref Rng & Units 05/22/2024   11:36 PM 07/03/2018    4:06 AM 07/02/2018    6:19 PM  CBC  WBC 4.0 - 10.5 K/uL 8.7  9.0    Hemoglobin 12.0 - 15.0 g/dL 85.7  87.2  84.9   Hematocrit 36.0 - 46.0 % 42.2  39.8  44.0   Platelets 150 - 400 K/uL 272  287      Lab Results  Component Value Date   CHOL 157 07/03/2018   HDL 42 07/03/2018   LDLCALC 96 07/03/2018   TRIG 94 07/03/2018   CHOLHDL 3.7 07/03/2018   Lab Results  Component Value Date   HGBA1C 5.5 07/03/2018  Labs: 11/25/2021: TC 215, TG 222, HDL 53, LDL 123.  Echocardiogram (05/13/2024): EF 30 to 35% with moderate decreased function.  No RWMA.  No LVH.  Normal RV.  Normal atrial sizes. Normal valves. Cardiac MRI pending:   ~14-day Zio patch monitor (June-July  2025)   Predominant underlying rhythm is sinus rhythm with a rate range of 44-1 7016 bpm with an average of 66 bpm.   Rare PACs with couplets triplets noted.   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  Ventricular bigeminy and trigeminy lasting up to 4 minutes of bigeminy and 15-1/2 minutes of trigeminy noted.   Frequent (116) Brief Runs of Ventricular Tachycardia (4+ PVCs): Fastest was 13 beats with a max rate of 194 bpm and longest was 10 beats with an average rate of 123 bpm.   2 Atrial Runs with the fastest and longest being 17 beats (8.1 seconds) with a max rate of 140 bpm, and average of  128 bpm.   Abnormal monitor with frequent PVCs and runs of VT although some of the VT runs appear to be aberrantly conducted SVT-more narrow complex.  Risk Assessment/Calculations:              Physical Exam:   VS:  BP 128/82   Pulse (!) 53   Ht 5' 6 (1.676 m)   Wt 245 lb 12.8 oz (111.5 kg)   SpO2 95%   BMI 39.67 kg/m    Wt Readings from Last 3 Encounters:  05/30/24 245 lb 12.8 oz (111.5 kg)  05/20/24 251 lb (113.9 kg)  04/13/24 248 lb (112.5 kg)      GEN: Borderline Morbidly Obese, but healthy-appearing, well groomed; in no acute distress;  NECK: No JVD; No carotid bruits CARDIAC:  RRR with bradycardic rate and frequent ectopy;Normal S1, and S2; no murmurs, rubs, gallops RESPIRATORY:  Clear to auscultation without rales, wheezing or rhonchi ; nonlabored, good air movement. ABDOMEN: Soft, non-tender, non-distended EXTREMITIES:  No edema; No deformity      ASSESSMENT AND PLAN: .    Problem List Items Addressed This Visit       Cardiology Problems   Dilated cardiomyopathy (HCC) (Chronic)   Dilated cardiomyopathy-only notable abnormality is frequent PVCs with bigeminy and short NSVT runs.  Dizziness with PVCs, especially on standing. No significant chest pain or exertional dyspnea. PVCs may reduce pump function.  MRI results to guide management,  potential coronary CTA or catheterization if needed.  Non-invasive evaluation preferred unless symptoms worsen.  - Continue metoprolol  succinate 50 mg daily, and Entresto  24-26 mg twice daily.  - In the absence of any active CHF symptoms we will hold off on diuretic including spironolactone-mostly to avoid electrolyte or maladies.  - Consider adding SGLT2 inhibitor following cardiac MRI - Continue to titrate mexiletine to full dose as tolerated per EP. - Cardiac MRI scheduled for June 15, 2024. - Reassess with a monitor after full dose of mexiletine is achieved. - Consider coronary CTA Versus Cardiac Catheterization based on MRI results. - Ensure adequate hydration to prevent dizziness.  - Checking chemistry panel along with BNP and TSH today      Relevant Orders   Lipid panel   Comprehensive metabolic panel with GFR   Brain natriuretic peptide   TSH   Hyperlipidemia with target LDL less than 130 (Chronic)   In light of her reduced EF on echo, concern for possible CAD etiology for PVCs and her family history making this more likely, we need to reassess her lipids and treat accordingly. - Check lipid panel along with chemistry panel today. Discussed treatment options and close follow-up after MRI.      Relevant Orders   Lipid panel   Comprehensive metabolic panel with GFR   Brain natriuretic peptide   TSH   NSVT (nonsustained ventricular tachycardia) (HCC) - Primary (Chronic)   Relevant Orders   Lipid panel   Comprehensive metabolic panel with GFR   Brain natriuretic peptide   TSH   PVC's (premature ventricular contractions) (Chronic)   Significant PVC burden with bigeminy and couplets as well as short NSVT runs. No active angina or heart failure symptoms. Cardiac MRI ordered -- pending results will need to consider ischemic evaluation with a coronary CTA versus cardiac catheterization Heart rate today was measured at 33, but on my reassessment was more close to  50-55-discounting PVCs Was started on mexiletine plus converted to Toprol  by EP ->  continue Toprol  50 mg but monitor heart  rate.   continue to titrate mexiletine 250 mg from 2 x daily to 3 x  daily       Relevant Orders   Lipid panel   Comprehensive metabolic panel with GFR   Brain natriuretic peptide   TSH     Other   At increased risk for cardiovascular disease (Chronic)   Family history of coronary artery disease. No current symptoms of coronary artery disease. Risk factors to be evaluated, including cholesterol and blood sugar. Coronary CTA considered if MRI is inconclusive. Non-invasive CTA preferred unless symptoms or test results indicate otherwise. - Order blood work to assess cholesterol levels and blood sugar. - Consider coronary CTA if MRI results are inconclusive.      Elevated blood pressure reading (Chronic)   Blood pressures continue to be elevated when she saw Dr. Nancey.   Appropriately started on Entresto  which she is tolerating well. Beta-blocker converted from nadolol  to Toprol  which she is also tolerating well.      Relevant Orders   Lipid panel   Comprehensive metabolic panel with GFR   Brain natriuretic peptide   TSH            Follow-Up: Return in about 4 weeks (around 06/27/2024) for Followup with Telemedicine, 3-4 month follow-up, Routine follow up with me.  I spent 48 minutes in the care of [facility] today including reviewing labs (2 min), reviewing studies (Echo & Monitor 5 min), face to face time discussing treatment options (25), reviewing records from clinic visit with Dr. Nancey (3 minutes), 13 minutes dictating, and documenting in the encounter.      Signed, Alm MICAEL Clay, MD, MS Alm Clay, M.D., M.S. Interventional Cardiologist  [facility] Pager # [phone]

## 2024-06-01 ENCOUNTER — Ambulatory Visit: Payer: Self-pay | Admitting: Cardiology

## 2024-06-01 LAB — BRAIN NATRIURETIC PEPTIDE: BNP: 178.4 pg/mL — AB (ref 0.0–100.0)

## 2024-06-01 LAB — COMPREHENSIVE METABOLIC PANEL WITH GFR
ALT: 17 IU/L (ref 0–32)
AST: 19 IU/L (ref 0–40)
Albumin: 4.3 g/dL (ref 3.9–4.9)
Alkaline Phosphatase: 109 IU/L (ref 41–116)
BUN/Creatinine Ratio: 17 (ref 9–23)
BUN: 16 mg/dL (ref 6–24)
Bilirubin Total: 1 mg/dL (ref 0.0–1.2)
CO2: 23 mmol/L (ref 20–29)
Calcium: 9.7 mg/dL (ref 8.7–10.2)
Chloride: 100 mmol/L (ref 96–106)
Creatinine, Ser: 0.94 mg/dL (ref 0.57–1.00)
Globulin, Total: 3.1 g/dL (ref 1.5–4.5)
Glucose: 107 mg/dL — ABNORMAL HIGH (ref 70–99)
Potassium: 4.4 mmol/L (ref 3.5–5.2)
Sodium: 137 mmol/L (ref 134–144)
Total Protein: 7.4 g/dL (ref 6.0–8.5)
eGFR: 74 mL/min/1.73 (ref >59)

## 2024-06-01 LAB — LIPID PANEL
Chol/HDL Ratio: 3.4 ratio (ref 0.0–4.4)
Cholesterol, Total: 181 mg/dL (ref 100–199)
HDL: 53 mg/dL (ref >39)
LDL Chol Calc (NIH): 103 mg/dL — ABNORMAL HIGH (ref 0–99)
Triglycerides: 144 mg/dL (ref 0–149)
VLDL Cholesterol Cal: 25 mg/dL (ref 5–40)

## 2024-06-01 LAB — TSH: TSH: 1.52 u[IU]/mL (ref 0.450–4.500)

## 2024-06-10 ENCOUNTER — Encounter: Payer: Self-pay | Admitting: Cardiovascular Disease

## 2024-06-13 ENCOUNTER — Encounter (HOSPITAL_COMMUNITY): Payer: Self-pay

## 2024-06-13 ENCOUNTER — Other Ambulatory Visit: Payer: Self-pay | Admitting: Family Medicine

## 2024-06-13 ENCOUNTER — Encounter: Payer: Self-pay | Admitting: Family Medicine

## 2024-06-13 DIAGNOSIS — Z1231 Encounter for screening mammogram for malignant neoplasm of breast: Secondary | ICD-10-CM

## 2024-06-13 MED ORDER — MEXILETINE HCL 150 MG PO CAPS: 150.0000 mg | ORAL_CAPSULE | Freq: Two times a day (BID) | ORAL | Status: DC

## 2024-06-14 ENCOUNTER — Other Ambulatory Visit (HOSPITAL_BASED_OUTPATIENT_CLINIC_OR_DEPARTMENT_OTHER): Payer: Self-pay

## 2024-06-15 ENCOUNTER — Ambulatory Visit (HOSPITAL_COMMUNITY)
Admission: RE | Admit: 2024-06-15 | Discharge: 2024-06-15 | Disposition: A | Discharge status: Home / Self Care | Source: Ambulatory Visit | Attending: Cardiovascular Disease | Admitting: Cardiovascular Disease

## 2024-06-15 ENCOUNTER — Other Ambulatory Visit: Payer: Self-pay | Admitting: Cardiovascular Disease

## 2024-06-15 DIAGNOSIS — I493 Ventricular premature depolarization: Secondary | ICD-10-CM | POA: Insufficient documentation

## 2024-06-15 DIAGNOSIS — I4729 Other ventricular tachycardia: Secondary | ICD-10-CM | POA: Diagnosis present

## 2024-06-15 DIAGNOSIS — I429 Cardiomyopathy, unspecified: Secondary | ICD-10-CM

## 2024-06-15 MED ORDER — GADOBUTROL 1 MMOL/ML IV SOLN
13.0000 mL | Freq: Once | INTRAVENOUS | Status: AC | PRN
  Administered 2024-06-15: 13 mL via INTRAVENOUS

## 2024-06-16 ENCOUNTER — Other Ambulatory Visit (HOSPITAL_BASED_OUTPATIENT_CLINIC_OR_DEPARTMENT_OTHER): Payer: Self-pay

## 2024-06-17 HISTORY — PX: OTHER SURGICAL HISTORY: SHX169

## 2024-07-07 ENCOUNTER — Other Ambulatory Visit (HOSPITAL_BASED_OUTPATIENT_CLINIC_OR_DEPARTMENT_OTHER): Payer: Self-pay

## 2024-07-08 ENCOUNTER — Ambulatory Visit: Admitting: Cardiovascular Disease

## 2024-07-11 ENCOUNTER — Other Ambulatory Visit: Payer: Self-pay | Admitting: Cardiovascular Disease

## 2024-07-11 ENCOUNTER — Other Ambulatory Visit (HOSPITAL_BASED_OUTPATIENT_CLINIC_OR_DEPARTMENT_OTHER): Payer: Self-pay

## 2024-07-12 ENCOUNTER — Other Ambulatory Visit (HOSPITAL_BASED_OUTPATIENT_CLINIC_OR_DEPARTMENT_OTHER): Payer: Self-pay

## 2024-07-12 ENCOUNTER — Encounter: Payer: Self-pay | Admitting: Cardiology

## 2024-07-12 ENCOUNTER — Ambulatory Visit: Attending: Cardiology | Admitting: Cardiology

## 2024-07-12 ENCOUNTER — Other Ambulatory Visit: Payer: Self-pay

## 2024-07-12 ENCOUNTER — Telehealth: Payer: Self-pay | Admitting: *Deleted

## 2024-07-12 VITALS — BP 122/78 | HR 65

## 2024-07-12 DIAGNOSIS — I4729 Other ventricular tachycardia: Secondary | ICD-10-CM | POA: Diagnosis not present

## 2024-07-12 DIAGNOSIS — I493 Ventricular premature depolarization: Secondary | ICD-10-CM | POA: Diagnosis not present

## 2024-07-12 DIAGNOSIS — I420 Dilated cardiomyopathy: Secondary | ICD-10-CM | POA: Diagnosis not present

## 2024-07-12 DIAGNOSIS — R072 Precordial pain: Secondary | ICD-10-CM | POA: Diagnosis not present

## 2024-07-12 DIAGNOSIS — E785 Hyperlipidemia, unspecified: Secondary | ICD-10-CM

## 2024-07-12 MED ORDER — DAPAGLIFLOZIN PROPANEDIOL 10 MG PO TABS: 10.0000 mg | ORAL_TABLET | Freq: Every day | ORAL | 5 refills | Status: DC

## 2024-07-12 MED ORDER — DAPAGLIFLOZIN PROPANEDIOL 10 MG PO TABS
10.0000 mg | ORAL_TABLET | Freq: Every day | ORAL | 5 refills | Status: AC
  Filled 2024-07-12 – 2024-08-04 (×4): qty 30, 30d supply, fill #0

## 2024-07-12 MED ORDER — MEXILETINE HCL 150 MG PO CAPS
150.0000 mg | ORAL_CAPSULE | Freq: Two times a day (BID) | ORAL | 3 refills | Status: AC
  Filled 2024-07-12: qty 180, 90d supply, fill #0

## 2024-07-12 MED ORDER — SPIRONOLACTONE 25 MG PO TABS
12.5000 mg | ORAL_TABLET | Freq: Every day | ORAL | 3 refills | Status: AC
  Filled 2024-07-12 – 2024-09-19 (×5): qty 45, 90d supply, fill #0

## 2024-07-12 MED ORDER — SPIRONOLACTONE 25 MG PO TABS: 12.5000 mg | ORAL_TABLET | Freq: Every day | ORAL | 2 refills | Status: DC

## 2024-07-12 NOTE — Patient Instructions (Addendum)
 Medication Instructions:  Start taking Spironolactone 12.5 mg  ( 1/2 tablet of 25 mg tablet) daily -- start now  Start taking Farxiga 10 mg daily  in one week ( starting 11/25 or 11/26)    One time dose of 100 mg - Metoprolol  succinate ( Toprol  Xl )  2 hour prior to Coronary CTA  *If you need a refill on your cardiac medications before your next appointment, please call your pharmacy*   Lab Work: non fasting  BMP in one week @ 07/19/24  If you have labs (blood work) drawn today and your tests are completely normal, you will receive your results only by: MyChart Message (if you have MyChart) OR A paper copy in the mail If you have any lab test that is abnormal or we need to change your treatment, we will call you to review the results.   Testing/Procedures: 1)Your physician has requested that you have coronary  CTA. Coronary computed tomography (CT)angiogram  is a special type of CT scan that uses a computer to produce multi-dimensional views of major blood vessels throughout the heart.  CT angiography, a contrast material is injected through an IV to help visualize the blood vessels  a painless test that uses an x-ray machine to take clear, detailed pictures of your heart arteries .  Please follow instruction sheet as given.  2)In Feb 2026 Your physician has requested that you have an echocardiogram (limited) . Echocardiography is a painless test that uses sound waves to create images of your heart. It provides your doctor with information about the size and shape of your heart and how well your heart's chambers and valves are working. This procedure takes approximately one hour. There are no restrictions for this procedure. Please do NOT wear cologne, perfume, aftershave, or lotions (deodorant is allowed). Please arrive 15 minutes prior to your appointment time.  Please note: We ask at that you not bring children with you during ultrasound (echo/ vascular) testing. Due to room size and  safety concerns, children are not allowed in the ultrasound rooms during exams. Our front office staff cannot provide observation of children in our lobby area while testing is being conducted. An adult accompanying a patient to their appointment will only be allowed in the ultrasound room at the discretion of the ultrasound technician under special circumstances. We apologize for any inconvenience.   Follow-Up: At [facility], you and your health needs are our priority.  As part of our continuing mission to provide you with exceptional heart care, we have created designated Provider Care Teams.  These Care Teams include your primary Cardiologist (physician) and Advanced Practice Providers (APPs -  Physician Assistants and Nurse Practitioners) who all work together to provide you with the care you need, when you need it.     Your next appointment:   4 month(s)  The format for your next appointment:   In Person  Provider:   Alm Clay, MD   Other Instructions     Your cardiac CT will be scheduled at  the below location:     [facility] [address]  [place], [place] [address]   If scheduled at the [facility] at [address], please enter the parking lot using the [address] entrance and use the FREE valet service at the patient drop-off area. Enter the building and check-in with registration on the main floor.   Please follow these instructions carefully (unless otherwise directed):  An  IV will be required for this test and Nitroglycerin will be given.    On the Night Before the Test: Be sure to Drink plenty of water. Do not consume any caffeinated/decaffeinated beverages or chocolate 12 hours prior to your test. Do not take any antihistamines 12 hours prior to your test.   On the Day of the Test: Drink plenty of water until 1 hour prior to the test. Do not eat any food 1 hour prior to test. You may take your  regular medications prior to the test.  Take metoprolol  succinate  100 mg two hours prior to test.( 2 tablets of your regular dose medication for this test only)  If you take Spironolactone please HOLD on the morning of the test. FEMALES- please wear underwire-free bra if available, avoid dresses & tight clothing        After the Test: Drink plenty of water. After receiving IV contrast, you may experience a mild flushed feeling. This is normal. On occasion, you may experience a mild rash up to 24 hours after the test. This is not dangerous. If this occurs, you can take Benadryl 25 mg, Zyrtec, Claritin, or Allegra and increase your fluid intake. (Patients taking Tikosyn should avoid Benadryl, and may take Zyrtec, Claritin, or Allegra) If you experience trouble breathing, this can be serious. If it is severe call 911 IMMEDIATELY. If it is mild, please call our office.  We will call to schedule your test 2-4 weeks out understanding that some insurance companies will need an authorization prior to the service being performed.   For more information and frequently asked questions, please visit our website : [email or web address]  For non-scheduling related questions, please contact the cardiac imaging nurse navigator should you have any questions/concerns: Cardiac Imaging Nurse Navigators Direct Office Dial: [phone]   For scheduling needs, including cancellations and rescheduling, please call Brittany, [phone].

## 2024-07-12 NOTE — Telephone Encounter (Signed)
  Patient Consent for Virtual Visit        Rhonda Duncan has provided verbal consent on 07/12/2024 for a virtual visit (video or telephone).   CONSENT FOR VIRTUAL VISIT FOR:  Rhonda Duncan  By participating in this virtual visit I agree to the following:  I hereby voluntarily request, consent and authorize [facility] and its employed or contracted physicians, physician assistants, nurse practitioners or other licensed health care professionals (the Practitioner), to provide me with telemedicine health care services (the "Services) as deemed necessary by the treating Practitioner. I acknowledge and consent to receive the Services by the Practitioner via telemedicine. I understand that the telemedicine visit will involve communicating with the Practitioner through live audiovisual communication technology and the disclosure of certain medical information by electronic transmission. I acknowledge that I have been given the opportunity to request an in-person assessment or other available alternative prior to the telemedicine visit and am voluntarily participating in the telemedicine visit.  I understand that I have the right to withhold or withdraw my consent to the use of telemedicine in the course of my care at any time, without affecting my right to future care or treatment, and that the Practitioner or I may terminate the telemedicine visit at any time. I understand that I have the right to inspect all information obtained and/or recorded in the course of the telemedicine visit and may receive copies of available information for a reasonable fee.  I understand that some of the potential risks of receiving the Services via telemedicine include:  Delay or interruption in medical evaluation due to technological equipment failure or disruption; Information transmitted may not be sufficient (e.g. poor resolution of images) to allow for appropriate medical decision  making by the Practitioner; and/or  In rare instances, security protocols could fail, causing a breach of personal health information.  Furthermore, I acknowledge that it is my responsibility to provide information about my medical history, conditions and care that is complete and accurate to the best of my ability. I acknowledge that Practitioner's advice, recommendations, and/or decision may be based on factors not within their control, such as incomplete or inaccurate data provided by me or distortions of diagnostic images or specimens that may result from electronic transmissions. I understand that the practice of medicine is not an exact science and that Practitioner makes no warranties or guarantees regarding treatment outcomes. I acknowledge that a copy of this consent can be made available to me via my patient portal [facility] MyChart), or I can request a printed copy by calling the office of [facility].    I understand that my insurance will be billed for this visit.   I have read or had this consent read to me. I understand the contents of this consent, which adequately explains the benefits and risks of the Services being provided via telemedicine.  I have been provided ample opportunity to ask questions regarding this consent and the Services and have had my questions answered to my satisfaction. I give my informed consent for the services to be provided through the use of telemedicine in my medical care

## 2024-07-12 NOTE — Assessment & Plan Note (Signed)
 Frequent PVCs seem to have improved with the titration of mexiletine.  Unable to tolerate the full TID dosing, but doing okay with twice daily dosing along with Toprol  50 mg daily.  Continue to follow-up with EP-Dr. Nancey with scheduled appointment on December 22  Continue to treat cardiomyopathy

## 2024-07-12 NOTE — Assessment & Plan Note (Signed)
 Lipid panel showed LDL 103. Target therapy based on results of Coronary CTA

## 2024-07-12 NOTE — Assessment & Plan Note (Signed)
 No more prolonged episodes that she can tell.  Overall symptoms seem to be improved.  Thankfully, cardiac MRI did not show any scar or infiltrative disease. With her having precordial discomfort, we will proceed with coronary CTA to exclude ischemic CAD as the potential reversible cause.

## 2024-07-12 NOTE — Assessment & Plan Note (Signed)
 Intermittent chest discomfort with non-ischemic causes likely, but coronary artery disease not excluded. CT coronary angiogram planned as non-invasive alternative to heart catheterization. - Will check Coronary CT Angiogram to evaluate for ischemic coronary artery disease.

## 2024-07-12 NOTE — Progress Notes (Deleted)
 {Choose 1 Note Type (Telehealth Visit or Telephone Visit):[phone]}   Patient has given verbal permission to conduct this visit via virtual appointment and to bill insurance 07/12/2024 11:19 AM     Evaluation Performed:  Follow-up visit  Date:  07/12/2024   ID:  Rhonda Duncan, DOB 09-23-1975, MRN [id number]  {Patient [phone]::Home} {Provider Location:[phone]::Home Office}  PCP:  Aisha Harvey, MD  Cardiologist:  Alm Clay, MD *** Electrophysiologist:  Eulas FORBES Furbish, MD   Chief Complaint:   No chief complaint on file.   ====================================  ASSESSMENT & PLAN:    Problem List Items Addressed This Visit       Cardiology Problems   Dilated cardiomyopathy (HCC) (Chronic)   Hyperlipidemia with target LDL less than 130 (Chronic)   NSVT (nonsustained ventricular tachycardia) (HCC) - Primary (Chronic)   PVC's (premature ventricular contractions) (Chronic)     Other   At increased risk for cardiovascular disease (Chronic)   Elevated blood pressure reading (Chronic)    ====================================  History of Present Illness:    Rhonda Duncan is a 48 y.o. female with PMH notable for *** who presents via audio/video conferencing for a telehealth visit today as a ***.  Rhonda Duncan was last seen ***  Hospitalizations:  ***   Recent - Interim CV studies:   The following studies were reviewed today: ***:  Inerval History   ***  Cardiovascular ROS: {[id number]:[id number]}   ROS:  Please see the history of present illness.     ROS  Past Medical History:  Diagnosis Date   Ventricular tachycardia [facility])    Past Surgical History:  Procedure Laterality Date   BREAST BIOPSY Right 2019     No outpatient medications have been marked as taking for the 07/12/24 encounter (Appointment) with Clay Alm ORN, MD.     Allergies:   Patient has no known allergies.   Social History    Tobacco Use   Smoking status: Never   Smokeless tobacco: Never  Vaping Use   Vaping status: Never Used  Substance Use Topics   Alcohol use: Yes   Drug use: Never     Family Hx: The patient's family history includes Heart attack in her paternal grandfather; Heart disease in her father. There is no history of Breast cancer.   Labs/Other Tests and Data Reviewed:    EKG:  {[id number]}  Recent Labs: 05/22/2024: Hemoglobin 14.2; Platelets 272 05/30/2024: ALT 17; BNP 178.4; BUN 16; Creatinine, Ser 0.94; Potassium 4.4; Sodium 137; TSH 1.520   Recent Lipid Panel Lab Results  Component Value Date/Time   CHOL 181 05/30/2024 09:12 AM   TRIG 144 05/30/2024 09:12 AM   HDL 53 05/30/2024 09:12 AM   CHOLHDL 3.4 05/30/2024 09:12 AM   CHOLHDL 3.7 07/03/2018 04:06 AM   LDLCALC 103 (H) 05/30/2024 09:12 AM    Wt Readings from Last 3 Encounters:  05/30/24 245 lb 12.8 oz (111.5 kg)  05/20/24 251 lb (113.9 kg)  04/13/24 248 lb (112.5 kg)     Objective:    Vital Signs:  There were no vitals taken for this visit.  {[facility] Virtual Exam (Optional):[phone]::VITAL SIGNS:  reviewed}   ==========================================  COVID-19 Education: The signs and symptoms of COVID-19 were discussed with the patient and how to seek care for testing (follow up with PCP or arrange E-visit).   The importance of social distancing was discussed today.  Time:   Today, I have spent *** minutes with the patient with telehealth technology discussing the above  problems.   An additional ***minutes spent charting (reviewing prior notes, hospital records, studies, labs etc.) Total ***minutes   Medication Adjustments/Labs and Tests Ordered: Current medicines are reviewed at length with the patient today.  Concerns regarding medicines are outlined above.   There are no Patient Instructions on file for this visit.   Signed, Alm Clay, MD  07/12/2024 11:19 AM    [facility]

## 2024-07-12 NOTE — Progress Notes (Signed)
 Virtual Visit via Video Note   Because of Rhonda Duncan's co-morbid illnesses, she is at least at moderate risk for complications without adequate follow up.  This format is felt to be most appropriate for this patient at this time.  All issues noted in this document were discussed and addressed.  A limited physical exam was performed with this format.  Please refer to the patient's chart for her consent to telehealth for Rhonda Duncan.      Patient has given verbal permission to conduct this visit via virtual appointment and to bill insurance 07/12/2024 11:19 AM     Evaluation Performed:  Follow-up visit  Date:  07/12/2024   ID:  Rhonda Duncan, DOB 09-15-1975, MRN [id number]  Patient Location: Home Provider Location: Office/Clinic  PCP:  Aisha Harvey, MD  Cardiologist:  Alm Clay, MD  Electrophysiologist:  Eulas FORBES Furbish, MD   Chief Complaint:   Chief Complaint  Patient presents with   Follow-up    Virtual follow-up to discuss cardiac MRI results   Palpitations    PVCs seen to be better controlled on mexiletine.   Cardiomyopathy    Cardiac MRI showed improved EF.  No CHF symptoms.    Patient Profile: .     Rhonda Duncan is a borderline morbidly obese 48 y.o. female with a PMH notable for frequent PVCs and brief runs of NSVT with Dilated Cardiomyopathy (EF 30 to 35%) who presents here for 1 month follow-up to discuss results of cardiac MRI.   Originally referred at the request of Aisha Harvey, MD.  [facility] notable for frequent PVCs (14.7%) and brief runs of NSVT . She been followed by Dr. Lynwood Rakers for several years and is now established with Dr. Furbish.  (Seen in August and September 2025) I first met her in May 2025 because of increased frequency of PVCs noted on her Apple watch.  Some dizziness but mostly positional.  Not really noting palpitations, but notified by her Apple Watch.  No chest pain or dyspnea.  No heart  failure symptoms of PND or orthopnea.  She had stopped her nadolol  20 mg which I restarted 10 mg and then we titrated up to 20, but she did not tolerate the higher dose due to dizziness.   Event monitor did not show significant PVC burden (14.7%) and I referred her to Dr. Furbish for reassessment after echocardiogram was done revealing reduced EF 30 to 35% => Nadolol  converted to Toprol  due to the echo showing reduced EF.  Entresto  24 over 26 mg twice daily was initiated along with  mexiletine 150 mg twice daily and a cardiac MRI was ordered.    Rhonda Duncan was last seen on May 30, 2024 in follow-up after echocardiogram and event monitor results and her visit with Dr. Mealor.=> She may be noticed some dizziness mostly positional usually standing up after long periods of sitting.  No chest pain or dyspnea at rest exertion.  No PND, orthopnea or edema. Plan was to consider adding SGLT2 inhibitor. Plan was also to reassess monitor after mexiletine dose achieved. Plan was also to consider cardiac catheterization or coronary CTA depending on MRI results. BMP and chemistry panel as well as lipid panel ordered.  Subjective  Discussed the use of AI scribe software for clinical note transcription with the patient, who gave verbal consent to proceed.  History of Present Illness Rhonda Duncan is a 48 year old female with palpitations and reduced ejection fraction  who presents for follow-up on her cardiac condition.  She has experienced an improvement in her palpitations with the new medication regimen. Initially, she was prescribed mexiletine three times a day but experienced side effects such as shakiness and a 'weird feeling' with the afternoon dose. The dosage was adjusted to twice a day, which she tolerates better. She is also on Toprol  and Entresto , which have helped reduce the frequency and duration of her PVCs.  Her resting heart rate averages 60-65 bpm, and her blood  pressure is generally around 120/80 mmHg. She experiences occasional chest discomfort described as 'almost like indigestion,' which occurs at rest and at various times of the day, but not daily. No shortness of breath when lying flat or during daily activities. Her heart rate ranges from 40 to 110 bpm, with the higher end occurring during physical activity. She has lost some weight recently, which she attributes to adjusting to the new medication and stress.  Recent diagnostic studies include an echocardiogram and an MRI. The echocardiogram showed a reduced ejection fraction of 30-35%, prompting further evaluation with an MRI, which showed an improved ejection fraction of 42%. The MRI did not show evidence of infiltrative disorders, inflammation, or prior myocardial infarction.  Current medications include Toprol , Mixilatine, and Entresto . She takes Toprol  50 mg, mexiletine twice daily, and Entresto  as prescribed. She has not experienced any urinary tract infections or yeast infections, which are potential side effects of some heart failure medications.  Cardiovascular ROS: positive for - chest pain, irregular heartbeat, palpitations, and notes the palpitations have become less frequent and less pronounced.  Not necessarily having tachycardia at rest.  She describes a chest discomfort which is random, not always associated with exertion, but can be associated with exertion. negative for - dyspnea on exertion, loss of consciousness, orthopnea, paroxysmal nocturnal dyspnea, rapid heart rate, shortness of breath, or any further episodes of lightheadedness, dizziness or wooziness, syncope/near syncope or TIA/amaurosis fugax.  No claudication.  ROS:  Review of Systems - Negative except symptoms noted above    Objective   Current Medications: Mexiletine 150 mg currently taking twice daily--was unable to tolerate TID. Toprol -XL 50 mg daily Entresto  24 over 26 mg twice daily Magnesium 400 mg p.o.    Family History - Father: CAD-CABG x 5  - Paternal grandfather: deceased at age 34 due to sudden heart attack  Studies Reviewed: SABRA       Lab Results  Component Value Date   CHOL 181 05/30/2024   HDL 53 05/30/2024   LDLCALC 103 (H) 05/30/2024   TRIG 144 05/30/2024   CHOLHDL 3.4 05/30/2024   Lab Results  Component Value Date   NA 137 05/30/2024   K 4.4 05/30/2024   CREATININE 0.94 05/30/2024   EGFR 74 05/30/2024   GLUCOSE 107 (H) 05/30/2024   Lab Results  Component Value Date   HGBA1C 5.5 07/03/2018  BNP was 178; TSH was 1.52  Recent Studies: Cardiac MRI: Moderate to severely dilated left ventricular size (anterior diastolic diameter 6.3 cm.).  Global hypokinesis with frequent PVCs.  LVE estimated 42%.  Mildly dilated RV with normal function.  RVEF 51%.  Mild MR.  NO evidence of myocardial infarction, inflammation or infiltrative disorder.  Findings consistent with nonischemic cardiomyopathy.  (06/17/2024)  Previous Studies Echocardiogram (05/13/2024): EF 30 to 35% with moderate decreased function.  No RWMA.  No LVH.  Normal RV.  Normal atrial sizes. Normal valves.   ~14-day Zio patch monitor (June-July 2025): Abnormal monitor with frequent PVCs  and runs of VT although some of the VT runs appear to be aberrantly conducted SVT-more narrow complex.   Predominant underlying rhythm is sinus rhythm with a rate range of 44-117 bpm with an average of 66 bpm.   Rare PACs with couplets triplets noted.   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  Ventricular bigeminy and trigeminy lasting up to 4 minutes of bigeminy and 15-1/2 minutes of trigeminy noted.   Frequent (116) Brief Runs of Ventricular Tachycardia (4+ PVCs): Fastest was 13 beats with a max rate of 194 bpm and longest was 10 beats with an average rate of 123 bpm.   2 Atrial Runs with the fastest and longest being 17 beats (8.1 seconds) with a max rate of 140 bpm, and average of 128  bpm.   Risk Assessment/Calculations:             Physical Exam:   VS:  BP 122/78   Pulse 65    Wt Readings from Last 3 Encounters:  05/30/24 245 lb 12.8 oz (111.5 kg)  05/20/24 251 lb (113.9 kg)  04/13/24 248 lb (112.5 kg)     GEN: Well nourished, well developed in no acute distress; RESPIRATORY:  nonlabored,     ASSESSMENT AND PLAN: .    Problem List Items Addressed This Visit       Cardiology Problems   Dilated cardiomyopathy (HCC) - Primary (Chronic)   Dilated cardiomyopathy with reduced ejection fraction and ventricular arrhythmias (PVCs and ventricular tachycardia) Ejection fraction improved to 42% on MRI. PVCs and ventricular tachycardia reduced in frequency and duration with current medications. MRI suggests non-ischemic, non-inflammatory etiology.  Potential for further improvement with continued management.  Discussed PVC ablation if necessary-pending success with mexiletine. Emphasized controlling PVCs to prevent cardiac remodeling and improve ejection fraction. - Check coronary CT angiogram to assess for coronary artery disease. - Recheck limited echocardiogram in 3 months.  - Continue Entresto  24-26 mg twice daily and Toprol  50 mg daily. - Start spironolactone at 12.5 mg daily, increase to 25 mg if tolerated -> if BP will tolerate, and renal function stable when she sees Dr. Nancey on December 22, would hope to titrate up to full 25.. - Start Jardiance (or Farxiga) 10 mg after one week if spironolactone is tolerated.   - Plan for EP is to schedule heart monitor for 3-4 days to assess PVCs.       Relevant Medications   spironolactone (ALDACTONE) 25 MG tablet   Other Relevant Orders   Basic metabolic panel with GFR   CT CORONARY MORPH W/CTA COR W/SCORE W/CA W/CM &/OR WO/CM   Hyperlipidemia with target LDL less than 130 (Chronic)   Lipid panel showed LDL 103. Target therapy based on results of Coronary CTA      Relevant Medications   spironolactone  (ALDACTONE) 25 MG tablet   NSVT (nonsustained ventricular tachycardia) (HCC) (Chronic)   No more prolonged episodes that she can tell.  Overall symptoms seem to be improved.  Thankfully, cardiac MRI did not show any scar or infiltrative disease. With her having precordial discomfort, we will proceed with coronary CTA to exclude ischemic CAD as the potential reversible cause.      Relevant Medications   spironolactone (ALDACTONE) 25 MG tablet   Other Relevant Orders   Basic metabolic panel with GFR   CT CORONARY MORPH W/CTA COR W/SCORE W/CA W/CM &/OR WO/CM   ECHOCARDIOGRAM LIMITED   PVC's (premature ventricular contractions) (Chronic)  Frequent PVCs seem to have improved with the titration of mexiletine.  Unable to tolerate the full TID dosing, but doing okay with twice daily dosing along with Toprol  50 mg daily.  Continue to follow-up with EP-Dr. Nancey with scheduled appointment on December 22  Continue to treat cardiomyopathy      Relevant Medications   spironolactone (ALDACTONE) 25 MG tablet   Other Relevant Orders   Basic metabolic panel with GFR   CT CORONARY MORPH W/CTA COR W/SCORE W/CA W/CM &/OR WO/CM   ECHOCARDIOGRAM LIMITED     Other   Precordial pain   Intermittent chest discomfort with non-ischemic causes likely, but coronary artery disease not excluded. CT coronary angiogram planned as non-invasive alternative to heart catheterization. - Will check Coronary CT Angiogram to evaluate for ischemic coronary artery disease.      Relevant Orders   Basic metabolic panel with GFR   CT CORONARY MORPH W/CTA COR W/SCORE W/CA W/CM &/OR WO/CM   ECHOCARDIOGRAM LIMITED            Follow-Up: Return in about 4 months (around 11/09/2024).  I spent 46 minutes in the care of Rhonda Duncan today including reviewing labs (1 minute), reviewing studies (cardiac MRI reviewed in comparison to echocardiogram and Zio patch-5 minutes), face to face time discussing treatment  options (25 minutes), reviewing records from previous clinic clinic notes from myself and Dr. Kennyth (6 minutes), 9 minutes dictating, and documenting in the encounter.      Signed, Alm MICAEL Clay, MD, MS Alm Clay, M.D., M.S. Interventional Cardiologist  [facility] Pager # [phone]

## 2024-07-12 NOTE — Assessment & Plan Note (Signed)
 Dilated cardiomyopathy with reduced ejection fraction and ventricular arrhythmias (PVCs and ventricular tachycardia) Ejection fraction improved to 42% on MRI. PVCs and ventricular tachycardia reduced in frequency and duration with current medications. MRI suggests non-ischemic, non-inflammatory etiology.  Potential for further improvement with continued management.  Discussed PVC ablation if necessary-pending success with mexiletine. Emphasized controlling PVCs to prevent cardiac remodeling and improve ejection fraction. - Check coronary CT angiogram to assess for coronary artery disease. - Recheck limited echocardiogram in 3 months.  - Continue Entresto  24-26 mg twice daily and Toprol  50 mg daily. - Start spironolactone at 12.5 mg daily, increase to 25 mg if tolerated -> if BP will tolerate, and renal function stable when she sees Dr. Nancey on December 22, would hope to titrate up to full 25.. - Start Jardiance (or Farxiga) 10 mg after one week if spironolactone is tolerated.   - Plan for EP is to schedule heart monitor for 3-4 days to assess PVCs.

## 2024-07-13 ENCOUNTER — Other Ambulatory Visit (HOSPITAL_BASED_OUTPATIENT_CLINIC_OR_DEPARTMENT_OTHER): Payer: Self-pay

## 2024-07-13 ENCOUNTER — Other Ambulatory Visit: Payer: Self-pay

## 2024-07-14 ENCOUNTER — Telehealth (HOSPITAL_BASED_OUTPATIENT_CLINIC_OR_DEPARTMENT_OTHER): Payer: Self-pay

## 2024-07-14 NOTE — Telephone Encounter (Signed)
   Pre-operative Risk Assessment    Patient Name: Rhonda Duncan  DOB: 1976/06/03 MRN: [id number]   Date of last office visit: Video Visit 07/12/2024 with Dr. Anner and In-Person Visit 05/30/2024 with Dr. Anner  Date of next office visit: 08/15/2024 with Dr. Nancey  Request for Surgical Clearance    Procedure:  Colonoscopy  Date of Surgery:  Clearance TBD                                 Surgeon:  Dr, Estelita Manas Surgeon's Group or Practice Name:  [facility]  Phone number:  [phone] Fax number:  [phone]   Type of Clearance Requested:   - Medical    Type of Anesthesia:  Propofol   Additional requests/questions:  None  SignedPatrcia Iverson CROME   07/14/2024, 5:13 PM

## 2024-07-20 ENCOUNTER — Ambulatory Visit
Admission: RE | Admit: 2024-07-20 | Discharge: 2024-07-20 | Disposition: A | Discharge status: Home / Self Care | Source: Ambulatory Visit | Attending: Family Medicine | Admitting: Family Medicine

## 2024-07-20 ENCOUNTER — Other Ambulatory Visit: Payer: Self-pay | Admitting: *Deleted

## 2024-07-20 DIAGNOSIS — I4729 Other ventricular tachycardia: Secondary | ICD-10-CM

## 2024-07-20 DIAGNOSIS — Z1231 Encounter for screening mammogram for malignant neoplasm of breast: Secondary | ICD-10-CM

## 2024-07-20 DIAGNOSIS — I420 Dilated cardiomyopathy: Secondary | ICD-10-CM

## 2024-07-20 DIAGNOSIS — R072 Precordial pain: Secondary | ICD-10-CM

## 2024-07-20 DIAGNOSIS — I493 Ventricular premature depolarization: Secondary | ICD-10-CM

## 2024-07-20 LAB — BASIC METABOLIC PANEL WITH GFR
BUN/Creatinine Ratio: 14 (ref 9–23)
BUN: 12 mg/dL (ref 6–24)
CO2: 23 mmol/L (ref 20–29)
Calcium: 9.6 mg/dL (ref 8.7–10.2)
Chloride: 101 mmol/L (ref 96–106)
Creatinine, Ser: 0.88 mg/dL (ref 0.57–1.00)
Glucose: 104 mg/dL — ABNORMAL HIGH (ref 70–99)
Potassium: 4.6 mmol/L (ref 3.5–5.2)
Sodium: 140 mmol/L (ref 134–144)
eGFR: 80 mL/min/1.73 (ref >59)

## 2024-07-22 ENCOUNTER — Other Ambulatory Visit (HOSPITAL_BASED_OUTPATIENT_CLINIC_OR_DEPARTMENT_OTHER): Payer: Self-pay

## 2024-07-23 ENCOUNTER — Ambulatory Visit: Payer: Self-pay | Admitting: Cardiology

## 2024-08-03 ENCOUNTER — Other Ambulatory Visit (HOSPITAL_BASED_OUTPATIENT_CLINIC_OR_DEPARTMENT_OTHER): Payer: Self-pay

## 2024-08-03 ENCOUNTER — Other Ambulatory Visit: Payer: Self-pay

## 2024-08-03 ENCOUNTER — Encounter (HOSPITAL_COMMUNITY): Payer: Self-pay

## 2024-08-04 ENCOUNTER — Other Ambulatory Visit (HOSPITAL_BASED_OUTPATIENT_CLINIC_OR_DEPARTMENT_OTHER): Payer: Self-pay

## 2024-08-05 ENCOUNTER — Ambulatory Visit (HOSPITAL_COMMUNITY): Admission: RE | Admit: 2024-08-05 | Discharge: 2024-08-05 | Attending: Cardiology | Admitting: Cardiology

## 2024-08-05 DIAGNOSIS — I493 Ventricular premature depolarization: Secondary | ICD-10-CM

## 2024-08-05 DIAGNOSIS — I420 Dilated cardiomyopathy: Secondary | ICD-10-CM

## 2024-08-05 DIAGNOSIS — R072 Precordial pain: Secondary | ICD-10-CM | POA: Diagnosis not present

## 2024-08-05 DIAGNOSIS — I4729 Other ventricular tachycardia: Secondary | ICD-10-CM

## 2024-08-05 MED ORDER — IOHEXOL 350 MG/ML SOLN
100.0000 mL | Freq: Once | INTRAVENOUS | Status: AC | PRN
  Administered 2024-08-05: 100 mL via INTRAVENOUS

## 2024-08-05 MED ORDER — NITROGLYCERIN 0.4 MG SL SUBL
0.8000 mg | SUBLINGUAL_TABLET | Freq: Once | SUBLINGUAL | Status: AC
  Administered 2024-08-05: 0.8 mg via SUBLINGUAL

## 2024-08-06 ENCOUNTER — Other Ambulatory Visit (HOSPITAL_BASED_OUTPATIENT_CLINIC_OR_DEPARTMENT_OTHER): Payer: Self-pay

## 2024-08-08 NOTE — Telephone Encounter (Signed)
° °  Patient Name: Rhonda Duncan  DOB: 03-07-1976 MRN: [id number]  Primary Cardiologist: Alm Clay, MD  Chart reviewed as part of pre-operative protocol coverage. Given past medical history and time since last visit, based on ACC/AHA guidelines, Rhonda Duncan is at acceptable risk for the planned procedure without further cardiovascular testing. Recent coronary CTA on 08/05/2024.  Per Dr. Clay: 08/05/2024 Great news on the coronary CT scan results.  The coronary calcium score was 0 and there was no notable evidence of heart artery disease.   Very positive results and reassuring.  No evidence of heart artery disease   Alm Clay, MD  The patient was advised that if she develops new symptoms prior to surgery to contact our office to arrange for a follow-up visit, and she verbalized understanding.  I will route this recommendation to the requesting party via Epic fax function and remove from pre-op pool.  Please call with questions.  Lamarr Satterfield, NP 08/08/2024, 9:05 AM

## 2024-08-15 ENCOUNTER — Encounter: Payer: Self-pay | Admitting: Cardiovascular Disease

## 2024-08-15 ENCOUNTER — Ambulatory Visit: Attending: Cardiovascular Disease | Admitting: Cardiovascular Disease

## 2024-08-15 ENCOUNTER — Ambulatory Visit

## 2024-08-15 VITALS — BP 150/78 | HR 72 | Ht 66.0 in | Wt 246.0 lb

## 2024-08-15 DIAGNOSIS — I420 Dilated cardiomyopathy: Secondary | ICD-10-CM

## 2024-08-15 DIAGNOSIS — I493 Ventricular premature depolarization: Secondary | ICD-10-CM | POA: Diagnosis not present

## 2024-08-15 DIAGNOSIS — I4729 Other ventricular tachycardia: Secondary | ICD-10-CM

## 2024-08-15 NOTE — Progress Notes (Unsigned)
 Enrolled patient for a 3 day Zio XT monitor to be mailed to patients home

## 2024-08-15 NOTE — Patient Instructions (Signed)
 Medication Instructions:  Your physician recommends that you continue on your current medications as directed. Please refer to the Current Medication list given to you today.  *If you need a refill on your cardiac medications before your next appointment, please call your pharmacy*   Testing/Procedures: Event Monitor  Your physician has recommended that you wear an event monitor. Event monitors are medical devices that record the hearts electrical activity. Doctors most often us  these monitors to diagnose arrhythmias. Arrhythmias are problems with the speed or rhythm of the heartbeat. The monitor is a small, portable device. You can wear one while you do your normal daily activities. This is usually used to diagnose what is causing palpitations/syncope (passing out).   Follow-Up: At [facility], you and your health needs are our priority.  As part of our continuing mission to provide you with exceptional heart care, our providers are all part of one team.  This team includes your primary Cardiologist (physician) and Advanced Practice Providers or APPs (Physician Assistants and Nurse Practitioners) who all work together to provide you with the care you need, when you need it.  Your next appointment:   6-8 weeks  Provider:   Eulas Furbish, MD     GEOFFRY HEWS- Long Term Monitor Instructions  Your physician has requested you wear a ZIO patch monitor for 3 days.  This is a single patch monitor. [employer] supplies one patch monitor per enrollment. Additional stickers are not available. Please do not apply patch if you will be having a Nuclear Stress Test,  Echocardiogram, Cardiac CT, MRI, or Chest Xray during the period you would be wearing the  monitor. The patch cannot be worn during these tests. You cannot remove and re-apply the  ZIO XT patch monitor.  Your ZIO patch monitor will be mailed 3 day USPS to your address on file. It may take 3-5 days  to receive your monitor after you  have been enrolled.  Once you have received your monitor, please review the enclosed instructions. Your monitor  has already been registered assigning a specific monitor serial # to you.  Billing and Patient Assistance Program Information  We have supplied [employer] with any of your insurance information on file for billing purposes. [employer] offers a sliding scale Patient Assistance Program for patients that do not have  insurance, or whose insurance does not completely cover the cost of the ZIO monitor.  You must apply for the Patient Assistance Program to qualify for this discounted rate.  To apply, please call [employer] at [phone], select option 4, select option 2, ask to apply for  Patient Assistance Program. Meredeth will ask your household income, and how many people  are in your household. They will quote your out-of-pocket cost based on that information.  [employer] will also be able to set up a 86-month, interest-free payment plan if needed.  Applying the monitor   Shave hair from upper left chest.  Hold abrader disc by orange tab. Rub abrader in 40 strokes over the upper left chest as  indicated in your monitor instructions.  Clean area with 4 enclosed alcohol pads. Let dry.  Apply patch as indicated in monitor instructions. Patch will be placed under collarbone on left  side of chest with arrow pointing upward.  Rub patch adhesive wings for 2 minutes. Remove white label marked 1. Remove the white  label marked 2. Rub patch adhesive wings for 2 additional minutes.  While looking in a mirror, press and release button in  center of patch. A small green light will  flash 3-4 times. This will be your only indicator that the monitor has been turned on.  Do not shower for the first 24 hours. You may shower after the first 24 hours.  Press the button if you feel a symptom. You will hear a small click. Record Date, Time and  Symptom in the Patient Logbook.  When you are ready to  remove the patch, follow instructions on the last 2 pages of Patient  Logbook. Stick patch monitor onto the last page of Patient Logbook.  Place Patient Logbook in the blue and white box. Use locking tab on box and tape box closed  securely. The blue and white box has prepaid postage on it. Please place it in the mailbox as  soon as possible. Your physician should have your test results approximately 7 days after the  monitor has been mailed back to [facility].  Call [facility] Customer Care at [phone] if you have questions regarding  your ZIO XT patch monitor. Call them immediately if you see an orange light blinking on your  monitor.  If your monitor falls off in less than 4 days, contact our Monitor department at [phone].  If your monitor becomes loose or falls off after 4 days call [employer] at [phone] for  suggestions on securing your monitor

## 2024-08-15 NOTE — Progress Notes (Signed)
 " Electrophysiology Office Note:    Date:  08/15/2024   ID:  Rhonda Duncan, DOB August 23, 1976, MRN [id number]  PCP:  Aisha Harvey, MD   [facility] Providers Cardiologist:  Alm Clay, MD Electrophysiologist:  Eulas FORBES Furbish, MD     Referring MD: Aisha Harvey, MD   History of Present Illness:    Rhonda Duncan is a 48 y.o. female with a medical history significant for PVCs, nonsustained VT, referred for arrhythmia management.     She was seen by Dr. Kelsie for history of PVCs and nonsustained VT managed with nadolol .   History of Present Illness Rhonda Duncan is a 48 year old female with a history of PVCs and non-sustained VT who presents for follow-up of her cardiac condition.  Nadolol  was restarted in May 2025 after an EKG showed PVCs and bigeminy. A monitor placed four weeks after restarting nadolol  showed a 14.7% burden of PVCs, sometimes occurring with non-sustained VT in bigeminy with sinus rhythm.  She experiences dizziness when getting up after sitting for extended periods, such as driving or sitting at her desk. No chest pain or shortness of breath. Initially, nadolol  at 10 mg improved her symptoms, but increasing to 20 mg led to a recurrence of dizziness. Reducing the dose back to 10 mg alleviated the dizziness.  Her heart rate drops to the 40s, particularly when inactive. She uses an Apple Watch to monitor her pulse. She has been on 20 mg of nadolol  during the monitoring period but has since reduced to 10 mg, which she feels has improved her symptoms.  He returns for follow-up regarding her echocardiogram which showed significantly depressed ejection fraction, now 30 to 35%.  She is continue to have episodes of palpitations and has understandably been stressed by her diagnosis.  In follow-up today, August 15, 2024, she reports that she is feeling much better.  She is having very few symptoms of PVCs.  Since her last  visit, a cardiac MRI showed that her ejection fraction is 42%.          Today, she is doing well and has no complaints  EKGs/Labs/Other Studies Reviewed Today:     Echocardiogram:  TTE March 12, 2024 LVEF 30 to 35%.  Normal valve structure and function.   Monitors:  14 day monitor July 2025-- my interpretation Heart rate 44 217 bpm, average 66 bpm.  14.7% PVC burden Symptom episodes correlate with PVCs.  1 episode showed ventricular couplets and triplets occurring in bigeminy with sinus rhythm.  Multiple PVC morphologies present.   EKG:         Physical Exam:    VS:  BP (!) 150/78 (BP Location: Left Arm, Patient Position: Sitting, Cuff Size: Large)   Pulse 72   Ht 5' 6 (1.676 m)   Wt 246 lb (111.6 kg)   SpO2 98%   BMI 39.71 kg/m     Wt Readings from Last 3 Encounters:  08/15/24 246 lb (111.6 kg)  05/30/24 245 lb 12.8 oz (111.5 kg)  05/20/24 251 lb (113.9 kg)     GEN: Well nourished, well developed in no acute distress CARDIAC: RRR, no murmurs, rubs, gallops RESPIRATORY:  Normal work of breathing MUSCULOSKELETAL: no edema    ASSESSMENT & PLAN:     Frequent PVCs Improved with nadolol  Her PVCs are often clustered --occurring relatively rarely but in bigeminy when they do occur --making her a poor candidate for ablation Switch from nadolol  to metoprolol  due to  cardiomyopathy Continue mexiletine 150 mg p.o. twice daily Will place monitor to quantify PVC burden  CHFrEF  NYHA II symptoms Etiology unclear --PVC burden less than 15% is a little low to cause cardiomyopathy CMR showed EF 42%. Switch from nadolol  20 mg to metoprolol  XL 50 mg daily Continue Entresto  24-26, Farxiga  10, spironolactone  25   Low heart rates Uncertain whether this is due to sinus bradycardia or apical pulse dissociation from PVCs Tends to be worse at higher doses of beta-blocker    Follow-up 3 months   Signed, Eulas FORBES Furbish, MD  08/15/2024 9:11 AM    [facility] "

## 2024-08-22 ENCOUNTER — Other Ambulatory Visit: Payer: Self-pay

## 2024-08-22 ENCOUNTER — Other Ambulatory Visit (HOSPITAL_BASED_OUTPATIENT_CLINIC_OR_DEPARTMENT_OTHER): Payer: Self-pay

## 2024-08-22 MED ORDER — PLENVU 140 G PO SOLR
ORAL | 0 refills | Status: AC
  Filled 2024-08-22: qty 3, 1d supply, fill #0

## 2024-08-23 ENCOUNTER — Ambulatory Visit
Admission: RE | Admit: 2024-08-23 | Discharge: 2024-08-23 | Disposition: A | Discharge status: Home / Self Care | Source: Ambulatory Visit | Attending: Family Medicine | Admitting: Family Medicine

## 2024-08-26 ENCOUNTER — Other Ambulatory Visit (HOSPITAL_BASED_OUTPATIENT_CLINIC_OR_DEPARTMENT_OTHER): Payer: Self-pay

## 2024-09-09 ENCOUNTER — Other Ambulatory Visit (HOSPITAL_BASED_OUTPATIENT_CLINIC_OR_DEPARTMENT_OTHER): Payer: Self-pay

## 2024-09-09 ENCOUNTER — Other Ambulatory Visit: Payer: Self-pay

## 2024-09-12 ENCOUNTER — Other Ambulatory Visit (HOSPITAL_BASED_OUTPATIENT_CLINIC_OR_DEPARTMENT_OTHER): Payer: Self-pay

## 2024-09-19 ENCOUNTER — Other Ambulatory Visit: Payer: Self-pay

## 2024-09-23 DIAGNOSIS — I4729 Other ventricular tachycardia: Secondary | ICD-10-CM

## 2024-09-23 DIAGNOSIS — I420 Dilated cardiomyopathy: Secondary | ICD-10-CM | POA: Diagnosis not present

## 2024-09-23 DIAGNOSIS — I493 Ventricular premature depolarization: Secondary | ICD-10-CM

## 2024-10-03 ENCOUNTER — Ambulatory Visit (HOSPITAL_COMMUNITY)

## 2024-10-14 ENCOUNTER — Ambulatory Visit: Admitting: Cardiovascular Disease

## 2024-11-07 ENCOUNTER — Ambulatory Visit: Admitting: Cardiology
